# Patient Record
Sex: Female | Born: 1945
Health system: Southern US, Community
[De-identification: ages and names within clinical notes are randomized; demographics above are authoritative.]

## PROBLEM LIST (undated history)

## (undated) DIAGNOSIS — F32A Depression, unspecified: Secondary | ICD-10-CM

## (undated) DIAGNOSIS — F329 Major depressive disorder, single episode, unspecified: Secondary | ICD-10-CM

## (undated) DIAGNOSIS — C50919 Malignant neoplasm of unspecified site of unspecified female breast: Secondary | ICD-10-CM

## (undated) DIAGNOSIS — R519 Headache, unspecified: Secondary | ICD-10-CM

## (undated) DIAGNOSIS — M1990 Unspecified osteoarthritis, unspecified site: Secondary | ICD-10-CM

## (undated) DIAGNOSIS — R51 Headache: Secondary | ICD-10-CM

## (undated) DIAGNOSIS — Z801 Family history of malignant neoplasm of trachea, bronchus and lung: Secondary | ICD-10-CM

## (undated) DIAGNOSIS — I10 Essential (primary) hypertension: Secondary | ICD-10-CM

## (undated) DIAGNOSIS — Z803 Family history of malignant neoplasm of breast: Secondary | ICD-10-CM

## (undated) DIAGNOSIS — F419 Anxiety disorder, unspecified: Secondary | ICD-10-CM

## (undated) DIAGNOSIS — Z800 Family history of malignant neoplasm of digestive organs: Secondary | ICD-10-CM

## (undated) DIAGNOSIS — E039 Hypothyroidism, unspecified: Secondary | ICD-10-CM

## (undated) HISTORY — PX: ADENOIDECTOMY: SUR15

## (undated) HISTORY — DX: Family history of malignant neoplasm of trachea, bronchus and lung: Z80.1

## (undated) HISTORY — DX: Malignant neoplasm of unspecified site of unspecified female breast: C50.919

## (undated) HISTORY — DX: Family history of malignant neoplasm of breast: Z80.3

## (undated) HISTORY — PX: ABDOMINAL HYSTERECTOMY: SHX81

## (undated) HISTORY — PX: BREAST SURGERY: SHX581

## (undated) HISTORY — DX: Family history of malignant neoplasm of digestive organs: Z80.0

## (undated) HISTORY — PX: TONSILLECTOMY: SUR1361

---

## 2005-09-21 ENCOUNTER — Ambulatory Visit (HOSPITAL_COMMUNITY): Admission: RE | Admit: 2005-09-21 | Discharge: 2005-09-22 | Payer: Self-pay | Admitting: Urology

## 2005-11-19 ENCOUNTER — Other Ambulatory Visit: Admission: RE | Admit: 2005-11-19 | Discharge: 2005-11-19 | Payer: Self-pay | Admitting: Family Medicine

## 2007-05-29 IMAGING — CR DG CHEST 2V
2 series · 2 of 2 positions shown · non-contrast
Comparison: none

CLINICAL DATA: Preop respiratory evaluation for rectocele and enterocele repair. 
 CHEST - 2 VIEW:

[w chest pa]
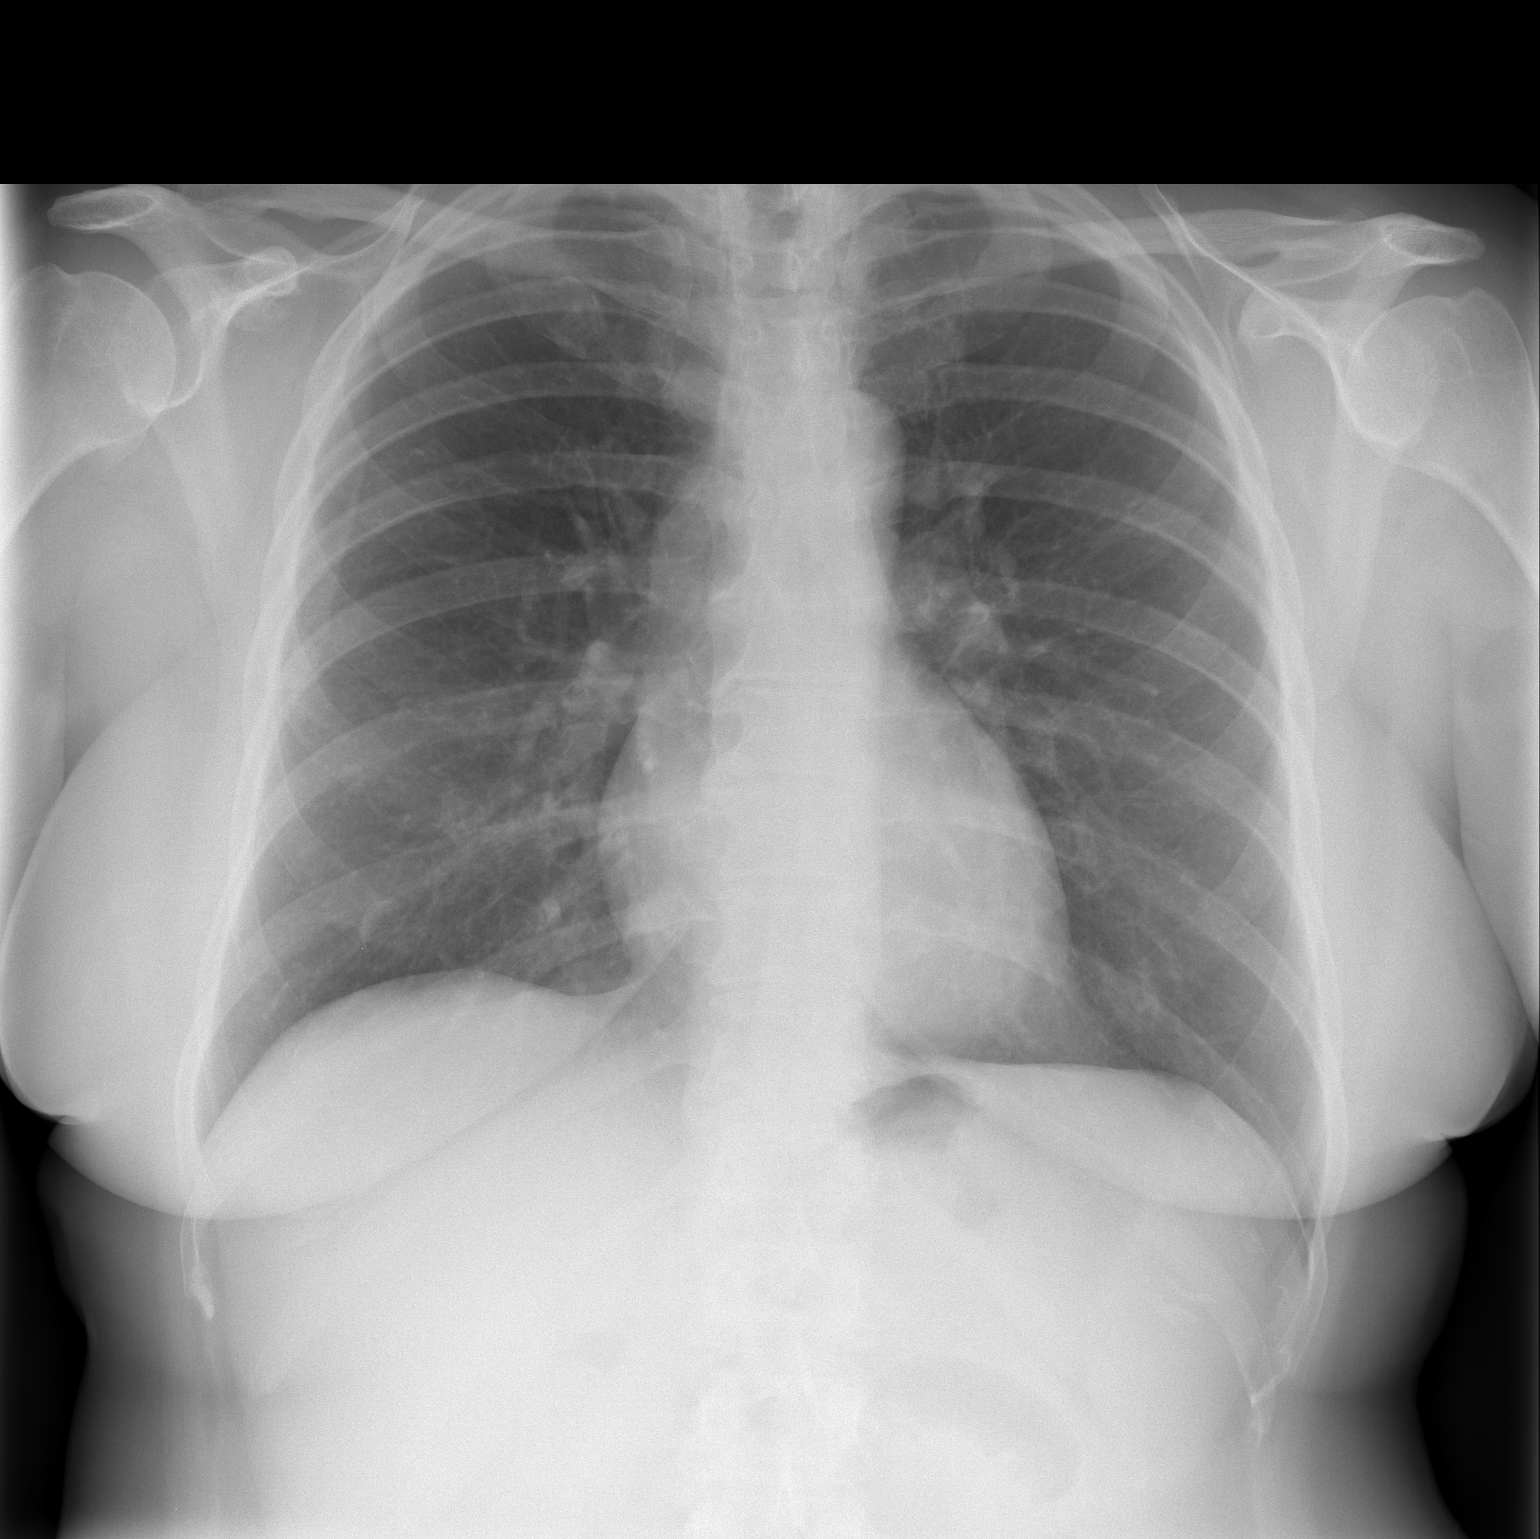

[w chest lat *]
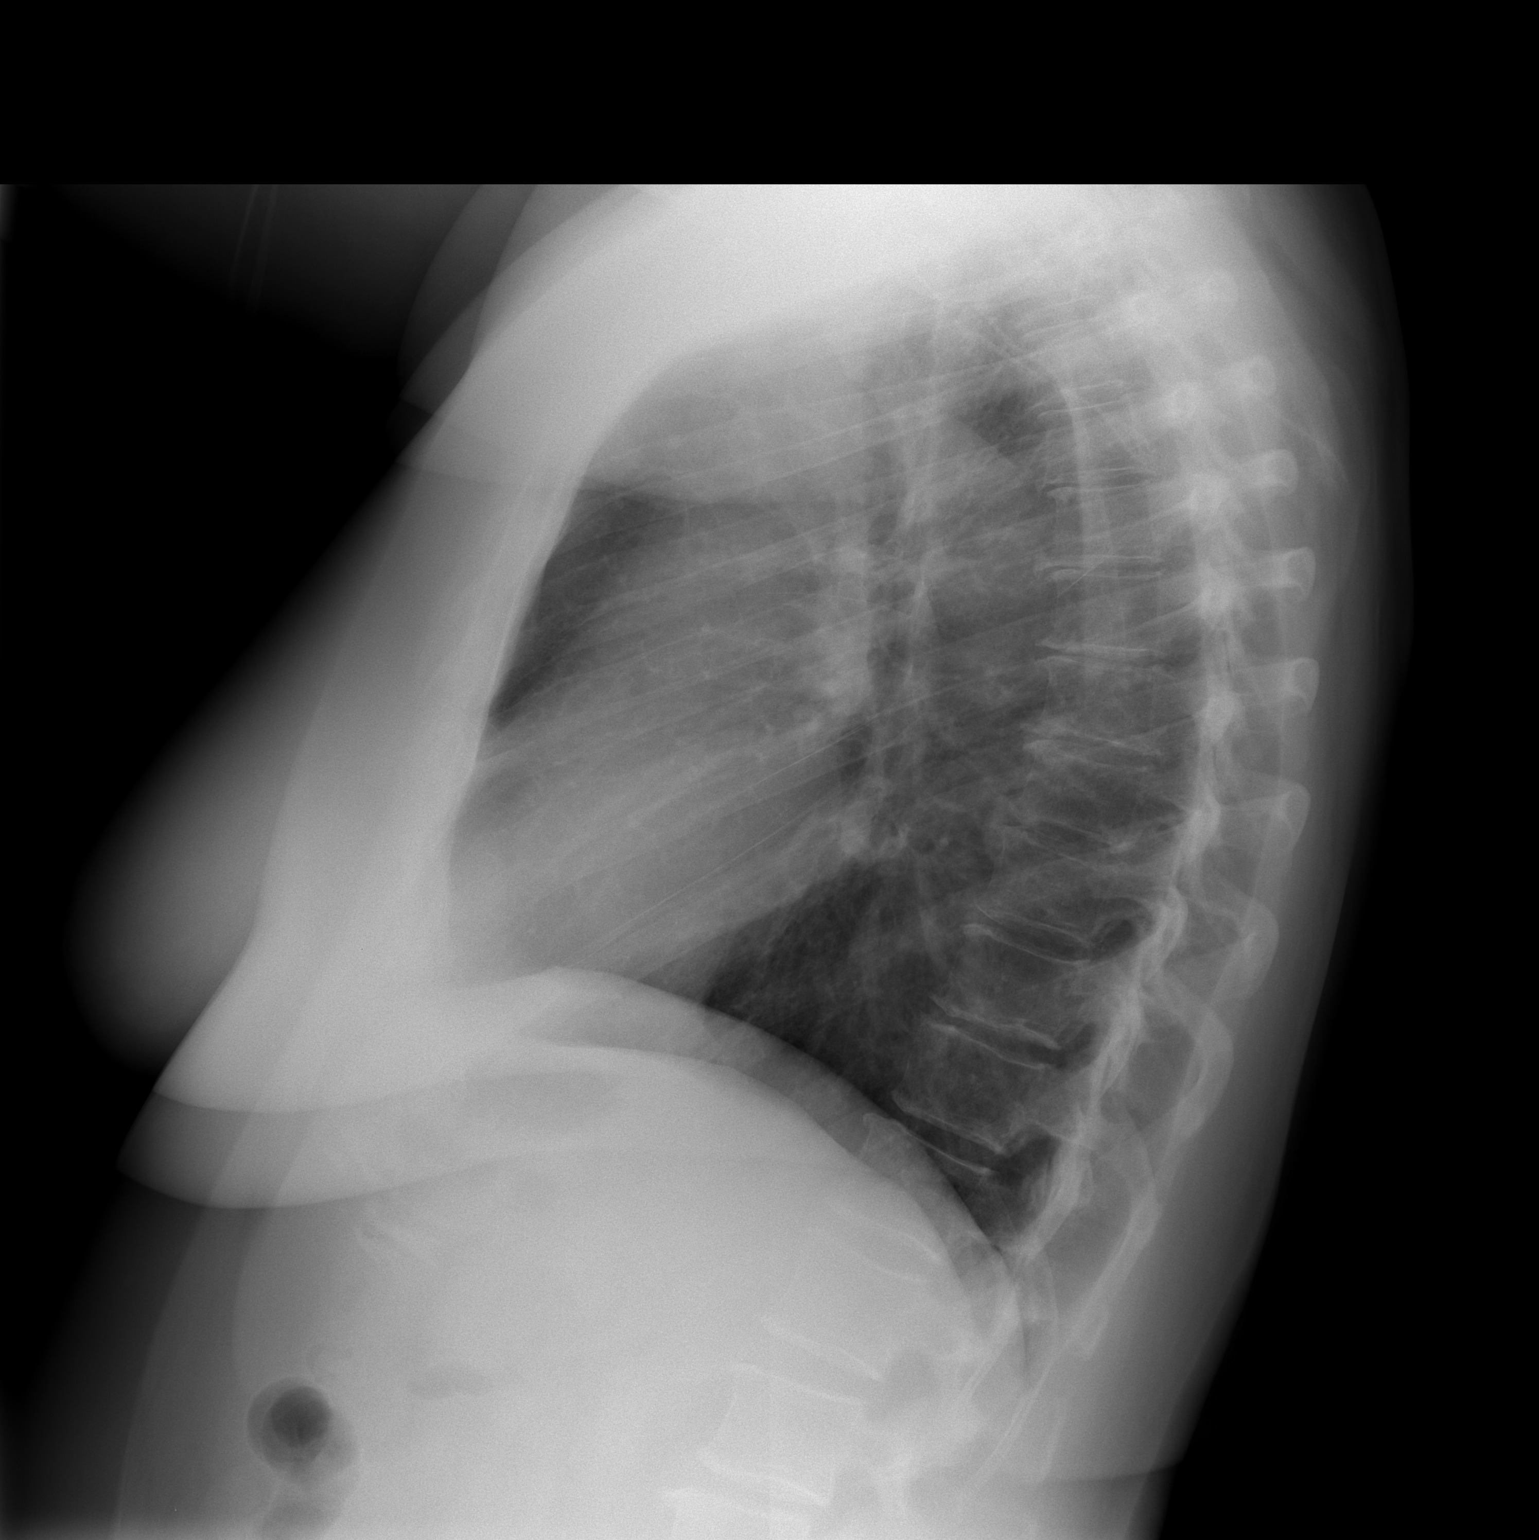

[2 of 2 positions shown; findings below may reference images not displayed]

FINDINGS: The heart size and mediastinal contours are within normal limits.  Both lungs are clear.  The visualized skeletal structures are unremarkable.
IMPRESSION: No active cardiopulmonary disease.

## 2007-12-25 ENCOUNTER — Other Ambulatory Visit: Admission: RE | Admit: 2007-12-25 | Discharge: 2007-12-25 | Payer: Self-pay | Admitting: Family Medicine

## 2008-12-25 ENCOUNTER — Other Ambulatory Visit: Admission: RE | Admit: 2008-12-25 | Discharge: 2008-12-25 | Payer: Self-pay | Admitting: Family Medicine

## 2010-07-03 NOTE — Op Note (Signed)
NAME:  Erika Huynh, Erika Huynh               ACCOUNT NO.:  [id number]   MEDICAL RECORD NO.:  [id number]          PATIENT TYPE:   LOCATION:                                 FACILITY:   PHYSICIAN:  Martina Sinner, MD      DATE OF BIRTH:   DATE OF PROCEDURE:  DATE OF DISCHARGE:                                 OPERATIVE REPORT   PREOPERATIVE DIAGNOSIS:  Rectocele (possible enterocele).   POSTOPERATIVE DIAGNOSIS:  Rectocele.   SURGERY:  Rectocele repair utilizing dermal graft.   Erika Huynh has a symptomatic posterior defect in keeping with a rectocele.  Based upon its size, I suspect she may have an enterocele but my index  suspicion was low when I examined her in the office.   The patient was prepped and draped in the usual fashion.  Extra care was  taken to minimize the risk of compartment syndrome, neuropathy and DVT when  I positioned her legs.  She was given preoperative antibiotics.   On initial examination one could easily identify the vaginal cuff dimples in  the posterior defect.  She had very short perineum.  I removed a small  triangle of perineal skin between two Allis clamps.  Using a lead Allis and  lateral Allis clamps for traction, I made a long posterior midline incision  to approximately 1.5 cm from the vaginal cuff dimples.  I sharply mobilized  the vaginal wall epithelium from the pubocervical fascia to the lateral  sidewall.  There was little to no bleeding, and the dissection went very  well.   Visually I thought she may have an enterocele, especially when I initially  did a digital rectal examination.  I did a number of digital rectal exams  and did a lot of sharp dissection, and my final diagnosis was preperitoneal  fat anterior to the rectum, but no enterocele cavity was located.  My  assistants did a good job in giving counter traction to allow me to sharply  and safely dissect the area question from the anterior vaginal wall.  The  area in question was also  dissected from the lateral wall and from the short  vaginal epithelial cuff.  Attempts at the level of the apex I entered the perineum approximately 1.5  cm in length.  Based upon dissection and by placing my finger into this  area, I felt this represented the lowest aspect of the peritoneum, i.e. the  cul-de-sac, and it did not represent the proximal aspect or neck of an  enterocele.   For this reason I closed the 1.5 cm opening with running 3-0 Vicryl.  Care  was taken not to catch the anterior vaginal epithelium in the closure.   I then did a slight defect repair by pulling the patient's apical and left  laterally displaced pubocervical fascia back to the right side of the  patient and attached it to the pelvic sidewall near the apex of dissection  but bilaterally.  The defect was apical and lateral.  Further 2-0 Vicryls  were used to tack down the rectovaginal fascia to the pelvic  sidewall,  getting deep into the wall laterally so not to leave a ring.   The site defect greatly reduced the rectocele, but I thought it was best to  do a gentle one-layer imbricating suture in the midline to flatten the  rectocele further.  This was done with 2-0 Vicryl on an SH needle.   Because of the size of the defect, I wanted to use a graft.  I also wanted  to place a graft over the small window that was made in the peritoneum.  Using a UR5 needle I placed four 2-0 Vicryl sutures in the pelvic sidewall,  two at the apex and two near the introitus.  Utilizing these, a usual 4 x 7  cm graft was sutured over the repair.  Approximately 2 cm of graft was left  like a flap cephalad, and this was sutured to the apex of 3-0 Vicryl,  allowing [place] to apply the graft over the apex.  The graft was trimmed  distally and sewn just proximal to the perineal body with a 3-0 Vicryl.   Copious irrigation was utilized.  Hemostasis was excellent.  Total blood  loss was less than 50 mL.  Epinephrine with lidocaine was  utilized at the  beginning of the case.  I trimmed approximately 0.5 cm of mucosa  bilaterally.  I then closed the vaginal epithelium with running 2-0 Vicryl  with a CT1 needle.  This was also used as a subcuticular suture in the  perineum.  I did not do any perineal sutures deep in the perineal body.   Vaginal length was excellent.  There was no ring in the vagina.  Hemostasis  was excellent.  A vaginal pack was inserted.   Hopefully, this repair will dramatically improve Erika Huynh quality of life  and improve her treatment goal.           ______________________________  Martina Sinner, MD  Electronically Signed     SAM/MEDQ  D:  09/21/2005  T:  09/21/2005  Job:  [id number]

## 2014-09-26 DIAGNOSIS — M170 Bilateral primary osteoarthritis of knee: Secondary | ICD-10-CM | POA: Diagnosis not present

## 2014-09-26 DIAGNOSIS — M545 Low back pain: Secondary | ICD-10-CM | POA: Diagnosis not present

## 2014-09-26 DIAGNOSIS — M160 Bilateral primary osteoarthritis of hip: Secondary | ICD-10-CM | POA: Diagnosis not present

## 2014-09-30 ENCOUNTER — Ambulatory Visit (INDEPENDENT_AMBULATORY_CARE_PROVIDER_SITE_OTHER): Payer: Medicare PPO | Admitting: Podiatry

## 2014-09-30 ENCOUNTER — Encounter: Payer: Self-pay | Admitting: Podiatry

## 2014-09-30 VITALS — BP 146/76 | HR 61 | Temp 97.9°F | Resp 14

## 2014-09-30 DIAGNOSIS — M79672 Pain in left foot: Secondary | ICD-10-CM | POA: Diagnosis not present

## 2014-09-30 DIAGNOSIS — M898X9 Other specified disorders of bone, unspecified site: Secondary | ICD-10-CM

## 2014-09-30 NOTE — Progress Notes (Signed)
Subjective:     Patient ID: Erika Huynh, female   DOB: [birth year], 69 y.o.   MRN: [id number]  HPIThis patient presents to the office with occasional swollen and painful knot on the center of her midfoot.  She says she has difficulty walking with laced shoes which aggravate the knot walking.  She says the knot has been painful and swollen for 2 months.  She presents to the office for evaluation and treatment.   Review of Systems     Objective:   Physical Exam GENERAL APPEARANCE: Alert, conversant. Appropriately groomed. No acute distress.  VASCULAR: Pedal pulses palpable at  [facility] and PT bilateral.  Capillary refill time is immediate to all digits,  Normal temperature gradient.  Digital hair growth is present bilateral  NEUROLOGIC: sensation is normal to 5.07 monofilament at 5/5 sites bilateral.  Light touch is intact bilateral, Muscle strength normal.  MUSCULOSKELETAL: acceptable muscle strength, tone and stability bilateral.  Intrinsic muscluature intact bilateral.  Rectus appearance of foot and digits noted bilateral. Bony prominence at the base second and third metatarsals left foot.  No radiating pain redness or swelling at this visit.  She has HAV deformities both feet.  DERMATOLOGIC: skin color, texture, and turgor are within normal limits.  No preulcerative lesions or ulcers  are seen, no interdigital maceration noted.  No open lesions present.  Digital nails are asymptomatic. No drainage noted.      Assessment:     Bony exostosis dorsum left foot  HAV  B/L  IE  Discussed condition with patient.  The x-rays were inoperable so x-rays were deferred at this time.     Plan:

## 2014-10-14 DIAGNOSIS — Z23 Encounter for immunization: Secondary | ICD-10-CM | POA: Diagnosis not present

## 2014-10-14 DIAGNOSIS — F324 Major depressive disorder, single episode, in partial remission: Secondary | ICD-10-CM | POA: Diagnosis not present

## 2015-04-08 NOTE — Addendum Note (Signed)
Addended by: Gardiner Barefoot on: 04/08/2015 12:40 PM   Modules accepted: Orders

## 2015-07-03 DIAGNOSIS — M25562 Pain in left knee: Secondary | ICD-10-CM | POA: Insufficient documentation

## 2015-11-06 ENCOUNTER — Other Ambulatory Visit: Payer: Self-pay | Admitting: Orthopedic Surgery

## 2015-12-01 ENCOUNTER — Encounter (HOSPITAL_COMMUNITY)
Admission: RE | Admit: 2015-12-01 | Discharge: 2015-12-01 | Disposition: A | Discharge status: Home / Self Care | Payer: Medicare Other | Source: Ambulatory Visit | Attending: Orthopedic Surgery | Admitting: Orthopedic Surgery

## 2015-12-01 ENCOUNTER — Encounter (HOSPITAL_COMMUNITY): Payer: Self-pay

## 2015-12-01 DIAGNOSIS — I10 Essential (primary) hypertension: Secondary | ICD-10-CM | POA: Diagnosis not present

## 2015-12-01 DIAGNOSIS — Z01818 Encounter for other preprocedural examination: Secondary | ICD-10-CM | POA: Insufficient documentation

## 2015-12-01 DIAGNOSIS — Z01812 Encounter for preprocedural laboratory examination: Secondary | ICD-10-CM | POA: Insufficient documentation

## 2015-12-01 DIAGNOSIS — M1711 Unilateral primary osteoarthritis, right knee: Secondary | ICD-10-CM | POA: Insufficient documentation

## 2015-12-01 HISTORY — DX: Hypothyroidism, unspecified: E03.9

## 2015-12-01 HISTORY — DX: Essential (primary) hypertension: I10

## 2015-12-01 HISTORY — DX: Headache: R51

## 2015-12-01 HISTORY — DX: Depression, unspecified: F32.A

## 2015-12-01 HISTORY — DX: Headache, unspecified: R51.9

## 2015-12-01 HISTORY — DX: Anxiety disorder, unspecified: F41.9

## 2015-12-01 HISTORY — DX: Major depressive disorder, single episode, unspecified: F32.9

## 2015-12-01 HISTORY — DX: Unspecified osteoarthritis, unspecified site: M19.90

## 2015-12-01 LAB — COMPREHENSIVE METABOLIC PANEL
ALT: 18 U/L (ref 14–54)
AST: 22 U/L (ref 15–41)
Albumin: 3.6 g/dL (ref 3.5–5.0)
Alkaline Phosphatase: 77 U/L (ref 38–126)
Anion gap: 6 (ref 5–15)
BILIRUBIN TOTAL: 0.6 mg/dL (ref 0.3–1.2)
BUN: 16 mg/dL (ref 6–20)
CO2: 28 mmol/L (ref 22–32)
CREATININE: 0.95 mg/dL (ref 0.44–1.00)
Calcium: 8.7 mg/dL — ABNORMAL LOW (ref 8.9–10.3)
Chloride: 107 mmol/L (ref 101–111)
GFR, EST NON AFRICAN AMERICAN: 58 mL/min — AB (ref >60)
Glucose, Bld: 85 mg/dL (ref 65–99)
POTASSIUM: 3.9 mmol/L (ref 3.5–5.1)
Sodium: 141 mmol/L (ref 135–145)
TOTAL PROTEIN: 6.1 g/dL — AB (ref 6.5–8.1)

## 2015-12-01 LAB — SURGICAL PCR SCREEN
MRSA, PCR: NEGATIVE
Staphylococcus aureus: NEGATIVE

## 2015-12-01 LAB — CBC WITH DIFFERENTIAL/PLATELET
BASOS ABS: 0 10*3/uL (ref 0.0–0.1)
Basophils Relative: 1 %
EOS PCT: 3 %
Eosinophils Absolute: 0.1 10*3/uL (ref 0.0–0.7)
HEMATOCRIT: 38.9 % (ref 36.0–46.0)
Hemoglobin: 12.8 g/dL (ref 12.0–15.0)
LYMPHS ABS: 1.5 10*3/uL (ref 0.7–4.0)
LYMPHS PCT: 30 %
MCH: 30.5 pg (ref 26.0–34.0)
MCHC: 32.9 g/dL (ref 30.0–36.0)
MCV: 92.8 fL (ref 78.0–100.0)
MONO ABS: 0.6 10*3/uL (ref 0.1–1.0)
MONOS PCT: 12 %
NEUTROS ABS: 2.6 10*3/uL (ref 1.7–7.7)
Neutrophils Relative %: 54 %
Platelets: 187 10*3/uL (ref 150–400)
RBC: 4.19 MIL/uL (ref 3.87–5.11)
RDW: 12.8 % (ref 11.5–15.5)
WBC: 4.8 10*3/uL (ref 4.0–10.5)

## 2015-12-01 NOTE — Pre-Procedure Instructions (Signed)
    Erika Huynh  12/01/2015      [facility] - [place], [place] [address] Phone: [phone] Fax: [phone]    Your procedure is scheduled on 12/08/15.  Report to [facility] Admitting at 840 A.M.  Call this number if you have problems the morning of surgery:  [phone]   Remember:  Do not eat food or drink liquids after midnight.  Take these medicines the morning of surgery with A SIP OF WATER --tylenol,lexapro,synthroid   Do not wear jewelry, make-up or nail polish.  Do not wear lotions, powders, or perfumes, or deoderant.  Do not shave 48 hours prior to surgery.  Men may shave face and neck.  Do not bring valuables to the hospital.  [facility] is not responsible for any belongings or valuables.  Contacts, dentures or bridgework may not be worn into surgery.  Leave your suitcase in the car.  After surgery it may be brought to your room.  For patients admitted to the hospital, discharge time will be determined by your treatment team.  Patients discharged the day of surgery will not be allowed to drive home.   Name and phone number of your driver:    Special instructions:  Do not take any aspirin,anti-inflammatories,vitamins,or herbal supplements 5-7 days prior to surgery.  Please read over the following fact sheets that you were given. MRSA Information

## 2015-12-05 MED ORDER — BUPIVACAINE LIPOSOME 1.3 % IJ SUSP
20.0000 mL | INTRAMUSCULAR | Status: AC
  Administered 2015-12-08: 20 mL
  Filled 2015-12-05: qty 20

## 2015-12-05 MED ORDER — DEXAMETHASONE SODIUM PHOSPHATE 10 MG/ML IJ SOLN
8.0000 mg | INTRAMUSCULAR | Status: AC
  Administered 2015-12-08: 10 mg via INTRAVENOUS
  Filled 2015-12-05: qty 1

## 2015-12-05 MED ORDER — TRANEXAMIC ACID 1000 MG/10ML IV SOLN
1000.0000 mg | INTRAVENOUS | Status: AC
  Administered 2015-12-08: 1000 mg via INTRAVENOUS
  Filled 2015-12-05: qty 10

## 2015-12-08 ENCOUNTER — Ambulatory Visit (HOSPITAL_COMMUNITY): Payer: Medicare Other | Admitting: Anesthesiology

## 2015-12-08 ENCOUNTER — Observation Stay (HOSPITAL_COMMUNITY)
Admission: RE | Admit: 2015-12-08 | Discharge: 2015-12-09 | Disposition: A | Discharge status: Home / Self Care | Payer: Medicare Other | Source: Ambulatory Visit | Attending: Orthopedic Surgery | Admitting: Orthopedic Surgery

## 2015-12-08 ENCOUNTER — Encounter (HOSPITAL_COMMUNITY)
Admission: RE | Disposition: A | Discharge status: Home / Self Care | Payer: Self-pay | Source: Ambulatory Visit | Attending: Orthopedic Surgery

## 2015-12-08 ENCOUNTER — Encounter (HOSPITAL_COMMUNITY): Payer: Self-pay | Admitting: *Deleted

## 2015-12-08 DIAGNOSIS — R262 Difficulty in walking, not elsewhere classified: Secondary | ICD-10-CM

## 2015-12-08 DIAGNOSIS — F329 Major depressive disorder, single episode, unspecified: Secondary | ICD-10-CM | POA: Diagnosis not present

## 2015-12-08 DIAGNOSIS — E039 Hypothyroidism, unspecified: Secondary | ICD-10-CM | POA: Diagnosis not present

## 2015-12-08 DIAGNOSIS — M25561 Pain in right knee: Secondary | ICD-10-CM

## 2015-12-08 DIAGNOSIS — Z6833 Body mass index (BMI) 33.0-33.9, adult: Secondary | ICD-10-CM | POA: Insufficient documentation

## 2015-12-08 DIAGNOSIS — Z885 Allergy status to narcotic agent status: Secondary | ICD-10-CM | POA: Insufficient documentation

## 2015-12-08 DIAGNOSIS — M1711 Unilateral primary osteoarthritis, right knee: Principal | ICD-10-CM | POA: Insufficient documentation

## 2015-12-08 DIAGNOSIS — F419 Anxiety disorder, unspecified: Secondary | ICD-10-CM | POA: Insufficient documentation

## 2015-12-08 DIAGNOSIS — Z7982 Long term (current) use of aspirin: Secondary | ICD-10-CM | POA: Insufficient documentation

## 2015-12-08 DIAGNOSIS — Z888 Allergy status to other drugs, medicaments and biological substances status: Secondary | ICD-10-CM | POA: Insufficient documentation

## 2015-12-08 DIAGNOSIS — M25661 Stiffness of right knee, not elsewhere classified: Secondary | ICD-10-CM

## 2015-12-08 DIAGNOSIS — Z96651 Presence of right artificial knee joint: Secondary | ICD-10-CM

## 2015-12-08 DIAGNOSIS — Z90710 Acquired absence of both cervix and uterus: Secondary | ICD-10-CM | POA: Diagnosis not present

## 2015-12-08 DIAGNOSIS — I10 Essential (primary) hypertension: Secondary | ICD-10-CM | POA: Diagnosis not present

## 2015-12-08 HISTORY — PX: PARTIAL KNEE ARTHROPLASTY: SHX2174

## 2015-12-08 SURGERY — ARTHROPLASTY, KNEE, UNICOMPARTMENTAL
Anesthesia: Spinal | Laterality: Right

## 2015-12-08 MED ORDER — MECLIZINE HCL 25 MG PO TABS
25.0000 mg | ORAL_TABLET | Freq: Three times a day (TID) | ORAL | Status: DC | PRN
  Filled 2015-12-08: qty 1

## 2015-12-08 MED ORDER — PHENOL 1.4 % MT LIQD: 1.0000 | OROMUCOSAL | Status: DC | PRN

## 2015-12-08 MED ORDER — CEFAZOLIN SODIUM-DEXTROSE 2-4 GM/100ML-% IV SOLN
2.0000 g | INTRAVENOUS | Status: AC
  Administered 2015-12-08: 2 g via INTRAVENOUS
  Filled 2015-12-08: qty 100

## 2015-12-08 MED ORDER — CHLORHEXIDINE GLUCONATE 4 % EX LIQD: 60.0000 mL | Freq: Once | CUTANEOUS | Status: DC

## 2015-12-08 MED ORDER — GABAPENTIN 300 MG PO CAPS
300.0000 mg | ORAL_CAPSULE | Freq: Three times a day (TID) | ORAL | Status: DC
  Administered 2015-12-08 – 2015-12-09 (×2): 300 mg via ORAL
  Filled 2015-12-08 (×3): qty 1

## 2015-12-08 MED ORDER — LEVOTHYROXINE SODIUM 75 MCG PO TABS
175.0000 ug | ORAL_TABLET | Freq: Every day | ORAL | Status: DC
  Administered 2015-12-09: 175 ug via ORAL
  Filled 2015-12-08: qty 1

## 2015-12-08 MED ORDER — ESCITALOPRAM OXALATE 10 MG PO TABS
20.0000 mg | ORAL_TABLET | Freq: Every day | ORAL | Status: DC
  Administered 2015-12-09: 20 mg via ORAL
  Filled 2015-12-08: qty 2

## 2015-12-08 MED ORDER — DOCUSATE SODIUM 100 MG PO CAPS
100.0000 mg | ORAL_CAPSULE | Freq: Two times a day (BID) | ORAL | Status: DC
  Administered 2015-12-08 – 2015-12-09 (×2): 100 mg via ORAL
  Filled 2015-12-08 (×3): qty 1

## 2015-12-08 MED ORDER — BUPIVACAINE IN DEXTROSE 0.75-8.25 % IT SOLN
INTRATHECAL | Status: DC | PRN
  Administered 2015-12-08: 13.5 mg via INTRATHECAL

## 2015-12-08 MED ORDER — MEPERIDINE HCL 25 MG/ML IJ SOLN: 6.2500 mg | INTRAMUSCULAR | Status: DC | PRN

## 2015-12-08 MED ORDER — DEXAMETHASONE SODIUM PHOSPHATE 10 MG/ML IJ SOLN
10.0000 mg | Freq: Once | INTRAMUSCULAR | Status: AC
  Administered 2015-12-09: 10 mg via INTRAVENOUS
  Filled 2015-12-08: qty 1

## 2015-12-08 MED ORDER — LOSARTAN POTASSIUM 50 MG PO TABS
100.0000 mg | ORAL_TABLET | Freq: Every day | ORAL | Status: DC
Start: 2015-12-08 — End: 2015-12-09
  Administered 2015-12-09: 100 mg via ORAL
  Filled 2015-12-08: qty 2

## 2015-12-08 MED ORDER — FENTANYL CITRATE (PF) 100 MCG/2ML IJ SOLN
INTRAMUSCULAR | Status: AC
  Filled 2015-12-08: qty 2

## 2015-12-08 MED ORDER — PHENYLEPHRINE HCL 10 MG/ML IJ SOLN
INTRAMUSCULAR | Status: DC | PRN
  Administered 2015-12-08: 40 ug via INTRAVENOUS
  Administered 2015-12-08: 80 ug via INTRAVENOUS
  Administered 2015-12-08: 40 ug via INTRAVENOUS

## 2015-12-08 MED ORDER — TRANEXAMIC ACID 1000 MG/10ML IV SOLN
1000.0000 mg | Freq: Once | INTRAVENOUS | Status: AC
  Administered 2015-12-08: 1000 mg via INTRAVENOUS
  Filled 2015-12-08: qty 10

## 2015-12-08 MED ORDER — MIDAZOLAM HCL 2 MG/2ML IJ SOLN
INTRAMUSCULAR | Status: AC
  Administered 2015-12-08: 1 mg
  Filled 2015-12-08: qty 2

## 2015-12-08 MED ORDER — INFLUENZA VAC SPLIT QUAD 0.5 ML IM SUSY
0.5000 mL | PREFILLED_SYRINGE | INTRAMUSCULAR | Status: AC
  Administered 2015-12-09: 0.5 mL via INTRAMUSCULAR
  Filled 2015-12-08: qty 0.5

## 2015-12-08 MED ORDER — MIDAZOLAM HCL 2 MG/2ML IJ SOLN: 0.5000 mg | Freq: Once | INTRAMUSCULAR | Status: DC | PRN

## 2015-12-08 MED ORDER — BUPIVACAINE-EPINEPHRINE (PF) 0.25% -1:200000 IJ SOLN
INTRAMUSCULAR | Status: DC | PRN
  Administered 2015-12-08: 20 mL

## 2015-12-08 MED ORDER — HYDROMORPHONE HCL 2 MG/ML IJ SOLN
1.0000 mg | INTRAMUSCULAR | Status: DC | PRN
  Administered 2015-12-08: 1 mg via INTRAVENOUS
  Filled 2015-12-08: qty 1

## 2015-12-08 MED ORDER — SENNOSIDES-DOCUSATE SODIUM 8.6-50 MG PO TABS: 1.0000 | ORAL_TABLET | Freq: Every evening | ORAL | Status: DC | PRN

## 2015-12-08 MED ORDER — DIPHENHYDRAMINE HCL 12.5 MG/5ML PO ELIX: 12.5000 mg | ORAL_SOLUTION | ORAL | Status: DC | PRN

## 2015-12-08 MED ORDER — PROPOFOL 500 MG/50ML IV EMUL
INTRAVENOUS | Status: DC | PRN
  Administered 2015-12-08: 20 ug/kg/min via INTRAVENOUS

## 2015-12-08 MED ORDER — METOCLOPRAMIDE HCL 5 MG PO TABS: 5.0000 mg | ORAL_TABLET | Freq: Three times a day (TID) | ORAL | Status: DC | PRN

## 2015-12-08 MED ORDER — ROPIVACAINE HCL 7.5 MG/ML IJ SOLN
INTRAMUSCULAR | Status: DC | PRN
  Administered 2015-12-08: 20 mL via PERINEURAL

## 2015-12-08 MED ORDER — METOCLOPRAMIDE HCL 5 MG/ML IJ SOLN: 5.0000 mg | Freq: Three times a day (TID) | INTRAMUSCULAR | Status: DC | PRN

## 2015-12-08 MED ORDER — BISACODYL 5 MG PO TBEC: 5.0000 mg | DELAYED_RELEASE_TABLET | Freq: Every day | ORAL | Status: DC | PRN

## 2015-12-08 MED ORDER — SODIUM CHLORIDE 0.9 % IV SOLN: INTRAVENOUS | Status: DC

## 2015-12-08 MED ORDER — ACETAMINOPHEN 650 MG RE SUPP: 650.0000 mg | Freq: Four times a day (QID) | RECTAL | Status: DC | PRN

## 2015-12-08 MED ORDER — MIDAZOLAM HCL 5 MG/ML IJ SOLN: 2.0000 mg | Freq: Once | INTRAMUSCULAR | Status: DC

## 2015-12-08 MED ORDER — EPHEDRINE SULFATE 50 MG/ML IJ SOLN
INTRAMUSCULAR | Status: DC | PRN
  Administered 2015-12-08: 5 mg via INTRAVENOUS

## 2015-12-08 MED ORDER — MIDAZOLAM HCL 2 MG/2ML IJ SOLN
INTRAMUSCULAR | Status: AC
  Filled 2015-12-08: qty 2

## 2015-12-08 MED ORDER — ONDANSETRON HCL 4 MG/2ML IJ SOLN
INTRAMUSCULAR | Status: DC | PRN
  Administered 2015-12-08: 4 mg via INTRAVENOUS

## 2015-12-08 MED ORDER — FLEET ENEMA 7-19 GM/118ML RE ENEM: 1.0000 | ENEMA | Freq: Once | RECTAL | Status: DC | PRN

## 2015-12-08 MED ORDER — OXYCODONE HCL 5 MG PO TABS
5.0000 mg | ORAL_TABLET | ORAL | Status: DC | PRN
  Administered 2015-12-08 – 2015-12-09 (×4): 10 mg via ORAL
  Filled 2015-12-08 (×4): qty 2

## 2015-12-08 MED ORDER — FENTANYL CITRATE (PF) 100 MCG/2ML IJ SOLN
INTRAMUSCULAR | Status: DC | PRN
  Administered 2015-12-08 (×2): 25 ug via INTRAVENOUS

## 2015-12-08 MED ORDER — OXYCODONE HCL ER 10 MG PO T12A
10.0000 mg | EXTENDED_RELEASE_TABLET | Freq: Two times a day (BID) | ORAL | Status: DC
  Administered 2015-12-08 – 2015-12-09 (×2): 10 mg via ORAL
  Filled 2015-12-08 (×2): qty 1

## 2015-12-08 MED ORDER — HYDROMORPHONE HCL 1 MG/ML IJ SOLN: 0.2500 mg | INTRAMUSCULAR | Status: DC | PRN

## 2015-12-08 MED ORDER — 0.9 % SODIUM CHLORIDE (POUR BTL) OPTIME
TOPICAL | Status: DC | PRN
  Administered 2015-12-08: 1000 mL

## 2015-12-08 MED ORDER — METHOCARBAMOL 500 MG PO TABS
500.0000 mg | ORAL_TABLET | Freq: Four times a day (QID) | ORAL | Status: DC | PRN
  Administered 2015-12-09: 500 mg via ORAL
  Filled 2015-12-08: qty 1

## 2015-12-08 MED ORDER — ZOLPIDEM TARTRATE 5 MG PO TABS: 5.0000 mg | ORAL_TABLET | Freq: Every evening | ORAL | Status: DC | PRN

## 2015-12-08 MED ORDER — ACETAMINOPHEN 500 MG PO TABS
ORAL_TABLET | ORAL | Status: AC
  Filled 2015-12-08: qty 2

## 2015-12-08 MED ORDER — METHOCARBAMOL 1000 MG/10ML IJ SOLN
500.0000 mg | Freq: Four times a day (QID) | INTRAMUSCULAR | Status: DC | PRN
  Filled 2015-12-08: qty 5

## 2015-12-08 MED ORDER — CELECOXIB 200 MG PO CAPS
200.0000 mg | ORAL_CAPSULE | Freq: Two times a day (BID) | ORAL | Status: DC
  Administered 2015-12-08 – 2015-12-09 (×2): 200 mg via ORAL
  Filled 2015-12-08 (×2): qty 1

## 2015-12-08 MED ORDER — ONDANSETRON HCL 4 MG/2ML IJ SOLN: 4.0000 mg | Freq: Four times a day (QID) | INTRAMUSCULAR | Status: DC | PRN

## 2015-12-08 MED ORDER — PROPOFOL 10 MG/ML IV BOLUS
INTRAVENOUS | Status: AC
  Filled 2015-12-08: qty 20

## 2015-12-08 MED ORDER — CEFAZOLIN IN D5W 1 GM/50ML IV SOLN
1.0000 g | Freq: Four times a day (QID) | INTRAVENOUS | Status: AC
  Administered 2015-12-08 – 2015-12-09 (×2): 1 g via INTRAVENOUS
  Filled 2015-12-08 (×2): qty 50

## 2015-12-08 MED ORDER — SODIUM CHLORIDE 0.9 % IV SOLN
INTRAVENOUS | Status: DC
  Administered 2015-12-08: 17:00:00 via INTRAVENOUS

## 2015-12-08 MED ORDER — ONDANSETRON HCL 4 MG PO TABS: 4.0000 mg | ORAL_TABLET | Freq: Four times a day (QID) | ORAL | Status: DC | PRN

## 2015-12-08 MED ORDER — SODIUM CHLORIDE 0.9 % IJ SOLN
INTRAMUSCULAR | Status: DC | PRN
  Administered 2015-12-08: 10 mL

## 2015-12-08 MED ORDER — ACETAMINOPHEN 325 MG PO TABS
650.0000 mg | ORAL_TABLET | Freq: Four times a day (QID) | ORAL | Status: DC | PRN
  Administered 2015-12-08: 650 mg via ORAL
  Filled 2015-12-08: qty 2

## 2015-12-08 MED ORDER — ASPIRIN EC 325 MG PO TBEC
325.0000 mg | DELAYED_RELEASE_TABLET | Freq: Two times a day (BID) | ORAL | Status: DC
  Administered 2015-12-08 – 2015-12-09 (×2): 325 mg via ORAL
  Filled 2015-12-08 (×2): qty 1

## 2015-12-08 MED ORDER — PROMETHAZINE HCL 25 MG/ML IJ SOLN: 6.2500 mg | INTRAMUSCULAR | Status: DC | PRN

## 2015-12-08 MED ORDER — LACTATED RINGERS IV SOLN
INTRAVENOUS | Status: DC
  Administered 2015-12-08 (×2): via INTRAVENOUS

## 2015-12-08 MED ORDER — ALUM & MAG HYDROXIDE-SIMETH 200-200-20 MG/5ML PO SUSP: 30.0000 mL | ORAL | Status: DC | PRN

## 2015-12-08 MED ORDER — ACETAMINOPHEN 500 MG PO TABS
1000.0000 mg | ORAL_TABLET | Freq: Once | ORAL | Status: AC
  Administered 2015-12-08: 1000 mg via ORAL

## 2015-12-08 MED ORDER — FENTANYL CITRATE (PF) 100 MCG/2ML IJ SOLN
100.0000 ug | Freq: Once | INTRAMUSCULAR | Status: AC
  Administered 2015-12-08: 100 ug via INTRAVENOUS

## 2015-12-08 MED ORDER — MENTHOL 3 MG MT LOZG: 1.0000 | LOZENGE | OROMUCOSAL | Status: DC | PRN

## 2015-12-08 SURGICAL SUPPLY — 76 items
APL SKNCLS STERI-STRIP NONHPOA (GAUZE/BANDAGES/DRESSINGS) ×1
BANDAGE ELASTIC 6 VELCRO ST LF (GAUZE/BANDAGES/DRESSINGS) ×2 IMPLANT
BANDAGE ESMARK 6X9 LF (GAUZE/BANDAGES/DRESSINGS) ×1 IMPLANT
BEARING TIBIAL IBAL UKA SZ1 9M (Knees) ×4 IMPLANT
BENZOIN TINCTURE PRP APPL 2/3 (GAUZE/BANDAGES/DRESSINGS) ×2 IMPLANT
BLADE RECIPROCATING TAPERED (BLADE) ×2 IMPLANT
BLADE SAW RECIP 87.9 MT (BLADE) IMPLANT
BLADE SAW SGTL 13X75X1.27 (BLADE) ×2 IMPLANT
BLADE SAW SGTL 83.5X18.5 (BLADE) ×2 IMPLANT
BNDG CMPR 9X6 STRL LF SNTH (GAUZE/BANDAGES/DRESSINGS) ×1
BNDG CMPR MED 10X6 ELC LF (GAUZE/BANDAGES/DRESSINGS) ×1
BNDG ELASTIC 6X10 VLCR STRL LF (GAUZE/BANDAGES/DRESSINGS) ×3 IMPLANT
BNDG ESMARK 6X9 LF (GAUZE/BANDAGES/DRESSINGS) ×3
BOWL SMART MIX CTS (DISPOSABLE) ×3 IMPLANT
BRNG TIB 1 9 RSRFC KN UNCNDL (Knees) ×2 IMPLANT
CEMENT BONE SIMPLEX SPEEDSET (Cement) ×5 IMPLANT
CLOSURE STERI-STRIP 1/2X4 (GAUZE/BANDAGES/DRESSINGS) ×1
CLSR STERI-STRIP ANTIMIC 1/2X4 (GAUZE/BANDAGES/DRESSINGS) ×1 IMPLANT
COMP FEMUR SZ2 IBAL RT MED (Knees) ×3 IMPLANT
COMPONENT FEMUR SZ2 IBALRT MED (Knees) IMPLANT
COVER SURGICAL LIGHT HANDLE (MISCELLANEOUS) ×4 IMPLANT
CUFF TOURNIQUET SINGLE 34IN LL (TOURNIQUET CUFF) ×3 IMPLANT
DRAPE EXTREMITY T 121X128X90 (DRAPE) ×3 IMPLANT
DRAPE IMP U-DRAPE 54X76 (DRAPES) ×3 IMPLANT
DRAPE INCISE IOBAN 66X45 STRL (DRAPES) ×5 IMPLANT
DRAPE PROXIMA HALF (DRAPES) ×1 IMPLANT
DRAPE U-SHAPE 47X51 STRL (DRAPES) ×3 IMPLANT
DRESSING AQUACEL AG SP 3.5X10 (GAUZE/BANDAGES/DRESSINGS) IMPLANT
DRSG ADAPTIC 3X8 NADH LF (GAUZE/BANDAGES/DRESSINGS) ×1 IMPLANT
DRSG AQUACEL AG SP 3.5X10 (GAUZE/BANDAGES/DRESSINGS) ×3
DRSG PAD ABDOMINAL 8X10 ST (GAUZE/BANDAGES/DRESSINGS) ×3 IMPLANT
DURAPREP 26ML APPLICATOR (WOUND CARE) ×4 IMPLANT
ELECT REM PT RETURN 9FT ADLT (ELECTROSURGICAL) ×3
ELECTRODE REM PT RTRN 9FT ADLT (ELECTROSURGICAL) ×1 IMPLANT
EVACUATOR 1/8 PVC DRAIN (DRAIN) ×1 IMPLANT
FLUID NSS /IRRIG 3000 ML XXX (IV SOLUTION) ×3 IMPLANT
GAUZE SPONGE 4X4 12PLY STRL (GAUZE/BANDAGES/DRESSINGS) ×1 IMPLANT
GLOVE BIOGEL M 7.0 STRL (GLOVE) ×2 IMPLANT
GLOVE BIOGEL PI IND STRL 7.5 (GLOVE) IMPLANT
GLOVE BIOGEL PI IND STRL 8.5 (GLOVE) ×5 IMPLANT
GLOVE BIOGEL PI INDICATOR 7.5 (GLOVE) ×2
GLOVE BIOGEL PI INDICATOR 8.5 (GLOVE) ×4
GLOVE SURG ORTHO 8.0 STRL STRW (GLOVE) ×8 IMPLANT
GOWN STRL REUS W/ TWL LRG LVL3 (GOWN DISPOSABLE) ×1 IMPLANT
GOWN STRL REUS W/ TWL XL LVL3 (GOWN DISPOSABLE) ×2 IMPLANT
GOWN STRL REUS W/TWL 2XL LVL3 (GOWN DISPOSABLE) ×1 IMPLANT
GOWN STRL REUS W/TWL LRG LVL3 (GOWN DISPOSABLE) ×3
GOWN STRL REUS W/TWL XL LVL3 (GOWN DISPOSABLE) ×6
HANDPIECE INTERPULSE COAX TIP (DISPOSABLE) ×3
HOOD PEEL AWAY FACE SHEILD DIS (HOOD) ×9 IMPLANT
KIT BASIN OR (CUSTOM PROCEDURE TRAY) ×3 IMPLANT
KIT ROOM TURNOVER OR (KITS) ×3 IMPLANT
MANIFOLD NEPTUNE II (INSTRUMENTS) ×3 IMPLANT
NDL HYPO 21X1 ECLIPSE (NEEDLE) ×1 IMPLANT
NEEDLE 21X1 OR PACK (NEEDLE) ×3 IMPLANT
NEEDLE HYPO 21X1 ECLIPSE (NEEDLE) ×6 IMPLANT
NS IRRIG 1000ML POUR BTL (IV SOLUTION) ×3 IMPLANT
PACK TOTAL JOINT (CUSTOM PROCEDURE TRAY) ×3 IMPLANT
PACK UNIVERSAL I (CUSTOM PROCEDURE TRAY) ×1 IMPLANT
PAD ARMBOARD 7.5X6 YLW CONV (MISCELLANEOUS) ×4 IMPLANT
PADDING CAST COTTON 6X4 STRL (CAST SUPPLIES) ×1 IMPLANT
SET HNDPC FAN SPRY TIP SCT (DISPOSABLE) ×1 IMPLANT
STAPLER VISISTAT 35W (STAPLE) ×1 IMPLANT
SUCTION FRAZIER HANDLE 10FR (MISCELLANEOUS) ×2
SUCTION TUBE FRAZIER 10FR DISP (MISCELLANEOUS) ×1 IMPLANT
SUT VIC AB 0 CT1 27 (SUTURE) ×6
SUT VIC AB 0 CT1 27XBRD ANBCTR (SUTURE) ×2 IMPLANT
SUT VIC AB 1 CT1 27 (SUTURE) ×3
SUT VIC AB 1 CT1 27XBRD ANBCTR (SUTURE) ×1 IMPLANT
SUT VIC AB 2-0 CT1 27 (SUTURE) ×3
SUT VIC AB 2-0 CT1 TAPERPNT 27 (SUTURE) ×1 IMPLANT
SYR 20CC LL (SYRINGE) ×6 IMPLANT
TIBIAL TRAY IBAL UKA SZ1 RT ME (Knees) ×3 IMPLANT
TOWEL OR 17X24 6PK STRL BLUE (TOWEL DISPOSABLE) ×3 IMPLANT
TOWEL OR 17X26 10 PK STRL BLUE (TOWEL DISPOSABLE) ×3 IMPLANT
TRAY TIBIAL IBAL UKA SZ1 RT ME (Knees) IMPLANT

## 2015-12-08 NOTE — Anesthesia Preprocedure Evaluation (Addendum)
Anesthesia Evaluation  Patient identified by MRN, date of birth, ID band Patient awake    Reviewed: Allergy & Precautions, NPO status , Patient's Chart, lab work & pertinent test results  History of Anesthesia Complications Negative for: history of anesthetic complications  Airway Mallampati: II  TM Distance: >3 FB Neck ROM: Full    Dental  (+) Dental Advisory Given   Pulmonary neg pulmonary ROS,    breath sounds clear to auscultation       Cardiovascular hypertension, Pt. on medications (-) angina Rhythm:Regular Rate:Normal     Neuro/Psych Anxiety Depression meniere's    GI/Hepatic negative GI ROS, Neg liver ROS,   Endo/Other  Hypothyroidism Morbid obesity  Renal/GU negative Renal ROS     Musculoskeletal  (+) Arthritis , Osteoarthritis,    Abdominal (+) + obese,   Peds  Hematology negative hematology ROS (+)   Anesthesia Other Findings   Reproductive/Obstetrics                            Anesthesia Physical Anesthesia Plan  ASA: II  Anesthesia Plan: Spinal   Post-op Pain Management:  Regional for Post-op pain   Induction:   Airway Management Planned: Natural Airway and Simple Face Mask  Additional Equipment:   Intra-op Plan:   Post-operative Plan:   Informed Consent: I have reviewed the patients History and Physical, chart, labs and discussed the procedure including the risks, benefits and alternatives for the proposed anesthesia with the patient or authorized representative who has indicated his/her understanding and acceptance.   Dental advisory given  Plan Discussed with: CRNA and Surgeon  Anesthesia Plan Comments: (Plan routine monitors, SAB with adductor canal block for post op analgesia)        Anesthesia Quick Evaluation

## 2015-12-08 NOTE — Evaluation (Signed)
Physical Therapy Evaluation Patient Details Name: Erika Huynh MRN: [id number] DOB: 1945/10/21 Today's Date: 12/08/2015   History of Present Illness  70 y.o. female admitted to [facility] on 12/08/15 for elective R unicompartmental TKA.  Pt with significant PMHx of HTN, HA, and depression/anxiety.  Clinical Impression  Pt is POD #0 and is moving well and able to walk a good distance down the hallway this evening.  I anticipate she will progress well enough to d/c home with family's assist.   PT to follow acutely for deficits listed below.       Follow Up Recommendations Home health PT;Supervision for mobility/OOB    Equipment Recommendations  3in1 (PT)    Recommendations for Other Services   NA    Precautions / Restrictions Precautions Precautions: Knee Precaution Booklet Issued: Yes (comment) Precaution Comments: exercise handout given Restrictions Weight Bearing Restrictions: Yes RLE Weight Bearing: Weight bearing as tolerated      Mobility  Bed Mobility Overal bed mobility: Modified Independent             General bed mobility comments: HOB elevated and pt using railing for leverage  Transfers Overall transfer level: Needs assistance Equipment used: Rolling walker (2 wheeled) Transfers: Sit to/from Stand Sit to Stand: Min guard         General transfer comment: Min guard assist for safety and balance check during transitions.  Verbal cues for safe hand placement.   Ambulation/Gait Ambulation/Gait assistance: Min guard Ambulation Distance (Feet): 130 Feet Assistive device: Rolling walker (2 wheeled) Gait Pattern/deviations: Step-through pattern;Antalgic   Gait velocity interpretation: Below normal speed for age/gender General Gait Details: mildly antalgic gait pattern, verbal cues for upright posture and safe use of RW (closer proximity to RW with her body).           Balance Overall balance assessment: Needs assistance Sitting-balance support: Feet  supported;No upper extremity supported Sitting balance-Leahy Scale: Good     Standing balance support: Bilateral upper extremity supported Standing balance-Leahy Scale: Poor                               Pertinent Vitals/Pain Pain Assessment: 0-10 Pain Score: 5  Pain Location: right knee Pain Descriptors / Indicators: Grimacing;Guarding Pain Intervention(s): Limited activity within patient's tolerance;Monitored during session;Repositioned    Home Living Family/patient expects to be discharged to:: Private residence Living Arrangements: Spouse/significant other Available Help at Discharge: Family;Available 24 hours/day Type of Home: House Home Access: Stairs to enter Entrance Stairs-Rails: None Entrance Stairs-Number of Steps: 2 Home Layout: Two level Home Equipment: Walker - 2 wheels;Shower seat      Prior Function Level of Independence: Independent                  Extremity/Trunk Assessment   Upper Extremity Assessment: Defer to OT evaluation           Lower Extremity Assessment: RLE deficits/detail RLE Deficits / Details: right leg with normal post op pain and weakness, some numbness in genitals and glutes, but not in LEs now.  Ankle at least 3/5, knee 3/5, hip 3/5    Cervical / Trunk Assessment: Normal  Communication   Communication: No difficulties  Cognition Arousal/Alertness: Awake/alert Behavior During Therapy: WFL for tasks assessed/performed Overall Cognitive Status: Within Functional Limits for tasks assessed                         Exercises  Total Joint Exercises Ankle Circles/Pumps: AROM;Both;20 reps Heel Slides: AROM;Right;5 reps Long Arc Quad: AROM;Right;5 reps   Assessment/Plan    PT Assessment Patient needs continued PT services  PT Problem List Decreased strength;Decreased range of motion;Decreased activity tolerance;Decreased balance;Decreased mobility;Decreased knowledge of use of DME;Decreased knowledge of  precautions;Pain          PT Treatment Interventions DME instruction;Gait training;Stair training;Functional mobility training;Therapeutic activities;Therapeutic exercise;Balance training;Patient/family education;Manual techniques;Modalities    PT Goals (Current goals can be found in the Care Plan section)  Acute Rehab PT Goals Patient Stated Goal: to go home tomorrow PT Goal Formulation: With patient Time For Goal Achievement: 12/15/15 Potential to Achieve Goals: Good    Frequency 7X/week        End of Session Equipment Utilized During Treatment: Gait belt Activity Tolerance: Patient tolerated treatment well Patient left: in chair;with call bell/phone within reach;with family/visitor present      Functional Assessment Tool Used: assist level Functional Limitation: Mobility: Walking and moving around Mobility: Walking and Moving Around Current Status [phone]): At least 20 percent but less than 40 percent impaired, limited or restricted Mobility: Walking and Moving Around Goal Status [phone]): At least 1 percent but less than 20 percent impaired, limited or restricted    Time: 1729-1753 PT Time Calculation (min) (ACUTE ONLY): 24 min   Charges:   PT Evaluation $PT Eval Moderate Complexity: 1 Procedure PT Treatments $Gait Training: 8-22 mins   PT G Codes:   PT G-Codes **NOT FOR INPATIENT CLASS** Functional Assessment Tool Used: assist level Functional Limitation: Mobility: Walking and moving around Mobility: Walking and Moving Around Current Status [id number]): At least 20 percent but less than 40 percent impaired, limited or restricted Mobility: Walking and Moving Around Goal Status [phone]): At least 1 percent but less than 20 percent impaired, limited or restricted    Erika Huynh B. Jacquelina Hewins, PT, DPT [phone]   12/08/2015, 6:00 PM

## 2015-12-08 NOTE — Anesthesia Procedure Notes (Signed)
Anesthesia Regional Block:  Adductor canal block  Pre-Anesthetic Checklist: ,, timeout performed, Correct Patient, Correct Site, Correct Laterality, Correct Procedure, Correct Position, site marked, Risks and benefits discussed,  Surgical consent,  Pre-op evaluation,  At surgeon's request and post-op pain management  Laterality: Right and Lower  Prep: chloraprep       Needles:  Injection technique: Single-shot  Needle Type: Echogenic Needle     Needle Length: 9cm 9 cm Needle Gauge: 22 and 22 G    Additional Needles:  Procedures: ultrasound guided (picture in chart) Adductor canal block Narrative:  Start time: 12/08/2015 10:54 AM End time: 12/08/2015 11:00 AM Injection made incrementally with aspirations every 5 mL.  Performed by: Personally  Anesthesiologist: Glennon Mac, Tiasha Helvie  Additional Notes: Pt identified in Holding room.  Monitors applied. Working IV access confirmed. Sterile prep, drape R thigh.  #22ga ECHOgenic needle into adductor canal with US guidance.  20cc 0.75% Ropivacaine injected incrementally after negative test dose.  Patient asymptomatic, VSS, no heme aspirated, tolerated well.

## 2015-12-08 NOTE — Transfer of Care (Signed)
Immediate Anesthesia Transfer of Care Note  Patient: Erika Huynh  Procedure(s) Performed: Procedure(s): UNICOMPARTMENTAL KNEE (Right)  Patient Location: PACU  Anesthesia Type:Spinal  Level of Consciousness: awake, alert  and oriented  Airway & Oxygen Therapy: Patient Spontanous Breathing and Patient connected to nasal cannula oxygen  Post-op Assessment: Report given to RN and Post -op Vital signs reviewed and stable  Post vital signs: Reviewed and stable  Last Vitals:  Vitals:   12/08/15 0853 12/08/15 1050  BP: 129/89 (!) 156/59  Pulse: 64 (!) 59  Resp: 20 15  Temp: 37 C     Last Pain:  Vitals:   12/08/15 0853  TempSrc: Oral      Patients Stated Pain Goal: 3 ([id number])  Complications: No apparent anesthesia complications

## 2015-12-08 NOTE — Anesthesia Procedure Notes (Signed)
Spinal  Patient location during procedure: OR End time: 12/08/2015 11:53 AM Staffing Anesthesiologist: Annye Asa Performed: anesthesiologist  Preanesthetic Checklist Completed: patient identified, site marked, surgical consent, pre-op evaluation, timeout performed, IV checked, risks and benefits discussed and monitors and equipment checked Spinal Block Patient position: sitting Prep: Betadine, ChloraPrep and site prepped and draped Patient monitoring: heart rate, cardiac monitor, continuous pulse ox and blood pressure Approach: right paramedian Location: L3-4 Injection technique: single-shot Needle Needle type: Quincke  Needle gauge: 25 G Needle length: 9 cm Additional Notes Pt identified in Operating room.  Monitors applied. Working IV access confirmed. Sterile prep, drape lumbar spine.  1% lido local L 3,4, unable to enter CSF at L 2,3 and L3,4.  #25ga Quincke into clear CSF R paramedian L 3,4.  13.5mg  0.75% Bupivacaine with dextrose injected with asp CSF beginning and end of injection.  Patient asymptomatic, no heme aspirated, tolerated well.  Jenita Seashore, MD

## 2015-12-08 NOTE — Progress Notes (Signed)
Orthopedic Tech Progress Note Patient Details:  Erika Huynh 06-13-1945 [id number]  CPM Right Knee CPM Right Knee: On Right Knee Flexion (Degrees): 90 Right Knee Extension (Degrees): 0 Additional Comments: trapeze bar patient helper   Hildred Priest 12/08/2015, 3:34 PM Viewed order from doctors order list.

## 2015-12-08 NOTE — Anesthesia Postprocedure Evaluation (Signed)
Anesthesia Post Note  Patient: Erika Huynh  Procedure(s) Performed: Procedure(s) (LRB): UNICOMPARTMENTAL KNEE (Right)  Patient location during evaluation: PACU Anesthesia Type: Spinal and Regional Level of consciousness: awake and alert, oriented and patient cooperative Pain management: pain level controlled Vital Signs Assessment: post-procedure vital signs reviewed and stable Respiratory status: spontaneous breathing, nonlabored ventilation and respiratory function stable Cardiovascular status: blood pressure returned to baseline and stable Postop Assessment: spinal receding and no signs of nausea or vomiting Anesthetic complications: no    Last Vitals:  Vitals:   12/08/15 1545 12/08/15 1643  BP: 126/74 131/71  Pulse: 60 62  Resp: 15 16  Temp: 36.7 C 36.8 C    Last Pain:  Vitals:   12/08/15 1657  TempSrc:   PainSc: 3                  Parris Cudworth,E. Sorin Frimpong

## 2015-12-08 NOTE — H&P (Signed)
Erika Huynh MRN:  [id number] DOB/SEX:  [birth year]/female  CHIEF COMPLAINT:  Painful right Knee  HISTORY: Patient is a 70 y.o. female presented with a history of pain in the right knee. Onset of symptoms was gradual starting a few years ago with gradually worsening course since that time. Patient has been treated conservatively with over-the-counter NSAIDs and activity modification. Patient currently rates pain in the knee at 10 out of 10 with activity. There is pain at night.  PAST MEDICAL HISTORY: There are no active problems to display for this patient.  Past Medical History:  Diagnosis Date  . Anxiety   . Arthritis   . Depression   . Headache   . Hypertension   . Hypothyroidism    Past Surgical History:  Procedure Laterality Date  . ABDOMINAL HYSTERECTOMY    . BREAST SURGERY     cyst removed     MEDICATIONS:   No prescriptions prior to admission.    ALLERGIES:   Allergies  Allergen Reactions  . Demerol [Meperidine] Nausea Only and Other (See Comments)    Can not eat, no appetite  . Propoxyphene Nausea Only and Other (See Comments)    NO APPETITE CAN NOT EAT    REVIEW OF SYSTEMS:  A comprehensive review of systems was negative except for: Musculoskeletal: positive for arthralgias and bone pain   FAMILY HISTORY:  No family history on file.  SOCIAL HISTORY:   Social History  Substance Use Topics  . Smoking status: Never Smoker  . Smokeless tobacco: Never Used  . Alcohol use 0.0 oz/week     Comment: occas.     EXAMINATION:  Vital signs in last 24 hours:    There were no vitals taken for this visit.  General Appearance:    Alert, cooperative, no distress, appears stated age  Head:    Normocephalic, without obvious abnormality, atraumatic  Eyes:    PERRL, conjunctiva/corneas clear, EOM's intact, fundi    benign, both eyes  Ears:    Normal TM's and external ear canals, both ears  Nose:   Nares normal, septum midline, mucosa normal, no drainage    or  sinus tenderness  Throat:   Lips, mucosa, and tongue normal; teeth and gums normal  Neck:   Supple, symmetrical, trachea midline, no adenopathy;    thyroid:  no enlargement/tenderness/nodules; no carotid   bruit or JVD  Back:     Symmetric, no curvature, ROM normal, no CVA tenderness  Lungs:     Clear to auscultation bilaterally, respirations unlabored  Chest Wall:    No tenderness or deformity   Heart:    Regular rate and rhythm, S1 and S2 normal, no murmur, rub   or gallop  Breast Exam:    No tenderness, masses, or nipple abnormality  Abdomen:     Soft, non-tender, bowel sounds active all four quadrants,    no masses, no organomegaly  Genitalia:    Normal female without lesion, discharge or tenderness  Rectal:    Normal tone, no masses or tenderness;   guaiac negative stool  Extremities:   Extremities normal, atraumatic, no cyanosis or edema  Pulses:   2+ and symmetric all extremities  Skin:   Skin color, texture, turgor normal, no rashes or lesions  Lymph nodes:   Cervical, supraclavicular, and axillary nodes normal  Neurologic:   CNII-XII intact, normal strength, sensation and reflexes    throughout     Musculoskeletal:  ROM 0-120, Ligaments intact,  Imaging Review Plain radiographs  demonstrate severe degenerative joint disease of the right knee medial compartment. The overall alignment is mild varus. The bone quality appears to be excellent for age and reported activity level.  Assessment/Plan: Primary osteoarthritis, right knee   The patient history, physical examination and imaging studies are consistent with advanced degenerative joint disease of the right knee medial compartment. The patient has failed conservative treatment.  The clearance notes were reviewed.  After discussion with the patient it was felt that Partial Knee Replacement was indicated. The procedure,  risks, and benefits of Partial knee arthroplasty were presented and reviewed. The risks including but not  limited to aseptic loosening, infection, blood clots, vascular injury, stiffness, patella tracking problems complications among others were discussed. The patient acknowledged the explanation, agreed to proceed with the plan. Donia Ast 12/08/2015, 6:31 AM

## 2015-12-09 ENCOUNTER — Encounter (HOSPITAL_COMMUNITY): Payer: Self-pay | Admitting: Orthopedic Surgery

## 2015-12-09 DIAGNOSIS — M1711 Unilateral primary osteoarthritis, right knee: Secondary | ICD-10-CM | POA: Diagnosis not present

## 2015-12-09 LAB — BASIC METABOLIC PANEL
ANION GAP: 8 (ref 5–15)
BUN: 12 mg/dL (ref 6–20)
CO2: 28 mmol/L (ref 22–32)
Calcium: 8.4 mg/dL — ABNORMAL LOW (ref 8.9–10.3)
Chloride: 102 mmol/L (ref 101–111)
Creatinine, Ser: 0.84 mg/dL (ref 0.44–1.00)
GFR calc Af Amer: >60 mL/min (ref >60)
GFR calc non Af Amer: >60 mL/min (ref >60)
GLUCOSE: 128 mg/dL — AB (ref 65–99)
POTASSIUM: 4.3 mmol/L (ref 3.5–5.1)
Sodium: 138 mmol/L (ref 135–145)

## 2015-12-09 LAB — CBC
HEMATOCRIT: 39.2 % (ref 36.0–46.0)
Hemoglobin: 12.7 g/dL (ref 12.0–15.0)
MCH: 30.7 pg (ref 26.0–34.0)
MCHC: 32.4 g/dL (ref 30.0–36.0)
MCV: 94.7 fL (ref 78.0–100.0)
Platelets: 203 10*3/uL (ref 150–400)
RBC: 4.14 MIL/uL (ref 3.87–5.11)
RDW: 13.1 % (ref 11.5–15.5)
WBC: 14.1 10*3/uL — AB (ref 4.0–10.5)

## 2015-12-09 MED ORDER — METHOCARBAMOL 500 MG PO TABS: 500.0000 mg | ORAL_TABLET | Freq: Four times a day (QID) | ORAL | 0 refills | Status: DC | PRN

## 2015-12-09 MED ORDER — ASPIRIN 325 MG PO TBEC: 325.0000 mg | DELAYED_RELEASE_TABLET | Freq: Two times a day (BID) | ORAL | 0 refills | Status: DC

## 2015-12-09 MED ORDER — OXYCODONE HCL 5 MG PO TABS
5.0000 mg | ORAL_TABLET | ORAL | 0 refills | Status: DC | PRN
Start: 2015-12-09 — End: 2017-07-28

## 2015-12-09 NOTE — Care Management Note (Signed)
Case Management Note  Patient Details  Name: Erika Huynh MRN: [id number] Date of Birth: 09-29-1945  Subjective/Objective:    70 yr old female s/p right unicompartmental knee arthroplasty.                Action/Plan: Case manager spoke with patient concerning discharge plan. Patient will go directly to outpatient therapy. [facility] has delivered RW and CPM to her home. Patient states she will have family support at discharge.   Expected Discharge Date:   12/09/15               Expected Discharge Plan:   Home/self care  In-House Referral:  NA  Discharge planning Services  CM Consult  Post Acute Care Choice:  NA Choice offered to:  Patient  DME Arranged:  CPM, Walker rolling DME Agency:  [facility]  HH Arranged:  NA (Patient going to Outpatient therapy) [place] Agency:  NA  Status of Service:  Completed, signed off  If discussed at [place] of Stay Meetings, dates discussed:    Additional Comments:  [name], RN 12/09/2015, 11:32 AM

## 2015-12-09 NOTE — Progress Notes (Signed)
SPORTS MEDICINE AND JOINT REPLACEMENT  Lara Mulch, MD    Carlyon Shadow, PA-C [place], [place], [place]  [address]                             [phone]   PROGRESS NOTE  Subjective:  negative for Chest Pain  negative for Shortness of Breath  negative for Nausea/Vomiting   negative for Calf Pain  negative for Bowel Movement   Tolerating Diet: yes         Patient reports pain as 4 on 0-10 scale.    Objective: Vital signs in last 24 hours:   Patient Vitals for the past 24 hrs:  BP Temp Temp src Pulse Resp SpO2 Height Weight  12/09/15 0540 132/77 97.9 F (36.6 C) Oral 88 18 98 % - -  12/09/15 0105 120/63 98.1 F (36.7 C) Oral 66 16 98 % - -  12/08/15 2130 118/61 98.2 F (36.8 C) Oral 71 16 99 % - -  12/08/15 1643 131/71 98.2 F (36.8 C) Oral 62 16 98 % - -  12/08/15 1545 126/74 98 F (36.7 C) - 60 15 98 % - -  12/08/15 1530 124/69 - - 61 15 - - -  12/08/15 1515 123/83 - - 61 16 100 % - -  12/08/15 1500 128/68 - - (!) 58 13 99 % - -  12/08/15 1445 115/80 - - 64 15 96 % - -  12/08/15 1430 134/77 - - 61 11 94 % - -  12/08/15 1415 131/79 - - (!) 59 13 98 % - -  12/08/15 1400 126/74 - - 65 16 94 % - -  12/08/15 1345 119/64 97 F (36.1 C) - 75 13 93 % - -  12/08/15 1050 (!) 156/59 - - (!) 59 15 98 % - -  12/08/15 0853 129/89 98.6 F (37 C) Oral 64 20 97 % 5\' 6"  (1.676 m) 94.3 kg (207 lb 14.4 oz)    @flow {1959:LAST@   Intake/Output from previous day:   10/23 0701 - 10/24 0700 In: 2060 [P.O.:480; I.V.:1430] Out: 550 [Urine:500]   Intake/Output this shift:   No intake/output data recorded.   Intake/Output      10/23 0701 - 10/24 0700 10/24 0701 - 10/25 0700   P.O. 480    I.V. (mL/kg) 1430 (15.2)    IV Piggyback 150    Total Intake(mL/kg) 2060 (21.8)    Urine (mL/kg/hr) 500    Blood 50    Total Output 550     Net +1510          Urine Occurrence 5 x       LABORATORY DATA: No results for input(s): WBC, HGB, HCT, PLT in the last 168 hours. No  results for input(s): NA, K, CL, CO2, BUN, CREATININE, GLUCOSE, CALCIUM in the last 168 hours. No results found for: INR, PROTIME  Examination:  General appearance: alert, cooperative and no distress Extremities: extremities normal, atraumatic, no cyanosis or edema  Wound Exam: clean, dry, intact   Drainage:  None: wound tissue dry  Motor Exam: Quadriceps and Hamstrings Intact  Sensory Exam: Superficial Peroneal, Deep Peroneal and Tibial normal   Assessment:    1 Day Post-Op  Procedure(s) (LRB): UNICOMPARTMENTAL KNEE (Right)  ADDITIONAL DIAGNOSIS:  Active Problems:   S/P right unicompartmental knee replacement     Plan: Physical Therapy as ordered Weight Bearing as Tolerated (WBAT)  DVT Prophylaxis:  Aspirin  DISCHARGE PLAN: Home  DISCHARGE NEEDS: HHPT   Patient doing well, expect D/C to home today         Donia Ast 12/09/2015, 7:05 AM

## 2015-12-09 NOTE — Discharge Summary (Signed)
SPORTS MEDICINE & JOINT REPLACEMENT   Erika Mulch, MD   Erika Shadow, PA-C [place], [place], [place]  [address]                             [phone]  PATIENT ID: Erika Huynh        MRN:  [id number]          DOB/AGE: 72-13-1947 / 70 y.o.    DISCHARGE SUMMARY  ADMISSION DATE:    12/08/2015 DISCHARGE DATE:   12/09/2015   ADMISSION DIAGNOSIS: primary osteoarthritis right knee    DISCHARGE DIAGNOSIS:  primary osteoarthritis right knee    ADDITIONAL DIAGNOSIS: Active Problems:   S/P right unicompartmental knee replacement  Past Medical History:  Diagnosis Date  . Anxiety   . Arthritis   . Depression   . Headache   . Hypertension   . Hypothyroidism     PROCEDURE: Procedure(s): UNICOMPARTMENTAL KNEE on 12/08/2015  CONSULTS:    HISTORY:  See H&P in chart  HOSPITAL COURSE:  Erika Huynh is a 70 y.o. admitted on 12/08/2015 and found to have a diagnosis of primary osteoarthritis right knee.  After appropriate laboratory studies were obtained  they were taken to the operating room on 12/08/2015 and underwent Procedure(s): UNICOMPARTMENTAL KNEE.   They were given perioperative antibiotics:  Anti-infectives    Start     Dose/Rate Route Frequency Ordered Stop   12/08/15 1700  ceFAZolin (ANCEF) IVPB 1 g/50 mL premix     1 g 100 mL/hr over 30 Minutes Intravenous Every 6 hours 12/08/15 1621 12/09/15 0140   12/08/15 1039  ceFAZolin (ANCEF) IVPB 2g/100 mL premix     2 g 200 mL/hr over 30 Minutes Intravenous On call to O.R. 12/08/15 1039 12/08/15 1202    .  Patient given tranexamic acid IV or topical and exparel intra-operatively.  Tolerated the procedure well.    POD# 1: Vital signs were stable.  Patient denied Chest pain, shortness of breath, or calf pain.  Patient was started on Lovenox 30 mg subcutaneously twice daily at 8am.  Consults to PT, OT, and care management were made.  The patient was weight bearing as tolerated.  CPM was placed on the  operative leg 0-90 degrees for 6-8 hours a day. When out of the CPM, patient was placed in the foam block to achieve full extension. Incentive spirometry was taught.  Dressing was changed.       POD #2, Continued  PT for ambulation and exercise program.  IV saline locked.  O2 discontinued.    The remainder of the hospital course was dedicated to ambulation and strengthening.   The patient was discharged on 1 Day Post-Op in  Good condition.  Blood products given:none  DIAGNOSTIC STUDIES: Recent vital signs: Patient Vitals for the past 24 hrs:  BP Temp Temp src Pulse Resp SpO2 Height Weight  12/09/15 0540 132/77 97.9 F (36.6 C) Oral 88 18 98 % - -  12/09/15 0105 120/63 98.1 F (36.7 C) Oral 66 16 98 % - -  12/08/15 2130 118/61 98.2 F (36.8 C) Oral 71 16 99 % - -  12/08/15 1643 131/71 98.2 F (36.8 C) Oral 62 16 98 % - -  12/08/15 1545 126/74 98 F (36.7 C) - 60 15 98 % - -  12/08/15 1530 124/69 - - 61 15 - - -  12/08/15 1515 123/83 - - 61 16 100 % - -  12/08/15 1500 128/68 - - (!) 58 13 99 % - -  12/08/15 1445 115/80 - - 64 15 96 % - -  12/08/15 1430 134/77 - - 61 11 94 % - -  12/08/15 1415 131/79 - - (!) 59 13 98 % - -  12/08/15 1400 126/74 - - 65 16 94 % - -  12/08/15 1345 119/64 97 F (36.1 C) - 75 13 93 % - -  12/08/15 1050 (!) 156/59 - - (!) 59 15 98 % - -  12/08/15 0853 129/89 98.6 F (37 C) Oral 64 20 97 % 5\' 6"  (1.676 m) 94.3 kg (207 lb 14.4 oz)       Recent laboratory studies: No results for input(s): WBC, HGB, HCT, PLT in the last 168 hours. No results for input(s): NA, K, CL, CO2, BUN, CREATININE, GLUCOSE, CALCIUM in the last 168 hours. No results found for: INR, PROTIME   Recent Radiographic Studies :  No results found.  DISCHARGE INSTRUCTIONS: Discharge Instructions    CPM    Complete by:  As directed    Continuous passive motion machine (CPM):      Use the CPM from 0 to 90 for 4-6 hours per day.      You may increase by 10 per day.  You may break it up  into 2 or 3 sessions per day.      Use CPM for 2 weeks or until you are told to stop.   Call MD / Call 911    Complete by:  As directed    If you experience chest pain or shortness of breath, CALL 911 and be transported to the hospital emergency room.  If you develope a fever above 101 F, pus (white drainage) or increased drainage or redness at the wound, or calf pain, call your surgeon's office.   Constipation Prevention    Complete by:  As directed    Drink plenty of fluids.  Prune juice may be helpful.  You may use a stool softener, such as Colace (over the counter) 100 mg twice a day.  Use MiraLax (over the counter) for constipation as needed.   Diet - low sodium heart healthy    Complete by:  As directed    Discharge instructions    Complete by:  As directed    INSTRUCTIONS AFTER JOINT REPLACEMENT   Remove items at home which could result in a fall. This includes throw rugs or furniture in walking pathways ICE to the affected joint every three hours while awake for 30 minutes at a time, for at least the first 3-5 days, and then as needed for pain and swelling.  Continue to use ice for pain and swelling. You may notice swelling that will progress down to the foot and ankle.  This is normal after surgery.  Elevate your leg when you are not up walking on it.   Continue to use the breathing machine you got in the hospital (incentive spirometer) which will help keep your temperature down.  It is common for your temperature to cycle up and down following surgery, especially at night when you are not up moving around and exerting yourself.  The breathing machine keeps your lungs expanded and your temperature down.   DIET:  As you were doing prior to hospitalization, we recommend a well-balanced diet.  DRESSING / WOUND CARE / SHOWERING  Keep the surgical dressing until follow up.  The dressing is water proof, so you can shower  without any extra covering.  IF THE DRESSING FALLS OFF or the wound  gets wet inside, change the dressing with sterile gauze.  Please use good hand washing techniques before changing the dressing.  Do not use any lotions or creams on the incision until instructed by your surgeon.    ACTIVITY  Increase activity slowly as tolerated, but follow the weight bearing instructions below.   No driving for 6 weeks or until further direction given by your physician.  You cannot drive while taking narcotics.  No lifting or carrying greater than 10 lbs. until further directed by your surgeon. Avoid periods of inactivity such as sitting longer than an hour when not asleep. This helps prevent blood clots.  You may return to work once you are authorized by your doctor.     WEIGHT BEARING   Weight bearing as tolerated with assist device (walker, cane, etc) as directed, use it as long as suggested by your surgeon or therapist, typically at least 4-6 weeks.   EXERCISES  Results after joint replacement surgery are often greatly improved when you follow the exercise, range of motion and muscle strengthening exercises prescribed by your doctor. Safety measures are also important to protect the joint from further injury. Any time any of these exercises cause you to have increased pain or swelling, decrease what you are doing until you are comfortable again and then slowly increase them. If you have problems or questions, call your caregiver or physical therapist for advice.   Rehabilitation is important following a joint replacement. After just a few days of immobilization, the muscles of the leg can become weakened and shrink (atrophy).  These exercises are designed to build up the tone and strength of the thigh and leg muscles and to improve motion. Often times heat used for twenty to thirty minutes before working out will loosen up your tissues and help with improving the range of motion but do not use heat for the first two weeks following surgery (sometimes heat can increase  post-operative swelling).   These exercises can be done on a training (exercise) mat, on the floor, on a table or on a bed. Use whatever works the best and is most comfortable for you.    Use music or television while you are exercising so that the exercises are a pleasant break in your day. This will make your life better with the exercises acting as a break in your routine that you can look forward to.   Perform all exercises about fifteen times, three times per day or as directed.  You should exercise both the operative leg and the other leg as well.   Exercises include:   Quad Sets - Tighten up the muscle on the front of the thigh (Quad) and hold for 5-10 seconds.   Straight Leg Raises - With your knee straight (if you were given a brace, keep it on), lift the leg to 60 degrees, hold for 3 seconds, and slowly lower the leg.  Perform this exercise against resistance later as your leg gets stronger.  Leg Slides: Lying on your back, slowly slide your foot toward your buttocks, bending your knee up off the floor (only go as far as is comfortable). Then slowly slide your foot back down until your leg is flat on the floor again.  Angel Wings: Lying on your back spread your legs to the side as far apart as you can without causing discomfort.  Hamstring Strength:  Lying on your back,  push your heel against the floor with your leg straight by tightening up the muscles of your buttocks.  Repeat, but this time bend your knee to a comfortable angle, and push your heel against the floor.  You may put a pillow under the heel to make it more comfortable if necessary.   A rehabilitation program following joint replacement surgery can speed recovery and prevent re-injury in the future due to weakened muscles. Contact your doctor or a physical therapist for more information on knee rehabilitation.    CONSTIPATION  Constipation is defined medically as fewer than three stools per week and severe constipation as  less than one stool per week.  Even if you have a regular bowel pattern at home, your normal regimen is likely to be disrupted due to multiple reasons following surgery.  Combination of anesthesia, postoperative narcotics, change in appetite and fluid intake all can affect your bowels.   YOU MUST use at least one of the following options; they are listed in order of increasing strength to get the job done.  They are all available over the counter, and you may need to use some, POSSIBLY even all of these options:    Drink plenty of fluids (prune juice may be helpful) and high fiber foods Colace 100 mg by mouth twice a day  Senokot for constipation as directed and as needed Dulcolax (bisacodyl), take with full glass of water  Miralax (polyethylene glycol) once or twice a day as needed.  If you have tried all these things and are unable to have a bowel movement in the first 3-4 days after surgery call either your surgeon or your primary doctor.    If you experience loose stools or diarrhea, hold the medications until you stool forms back up.  If your symptoms do not get better within 1 week or if they get worse, check with your doctor.  If you experience "the worst abdominal pain ever" or develop nausea or vomiting, please contact the office immediately for further recommendations for treatment.   ITCHING:  If you experience itching with your medications, try taking only a single pain pill, or even half a pain pill at a time.  You can also use Benadryl over the counter for itching or also to help with sleep.   TED HOSE STOCKINGS:  Use stockings on both legs until for at least 2 weeks or as directed by physician office. They may be removed at night for sleeping.  MEDICATIONS:  See your medication summary on the "After Visit Summary" that nursing will review with you.  You may have some home medications which will be placed on hold until you complete the course of blood thinner medication.  It is  important for you to complete the blood thinner medication as prescribed.  PRECAUTIONS:  If you experience chest pain or shortness of breath - call 911 immediately for transfer to the hospital emergency department.   If you develop a fever greater that 101 F, purulent drainage from wound, increased redness or drainage from wound, foul odor from the wound/dressing, or calf pain - CONTACT YOUR SURGEON.                                                   FOLLOW-UP APPOINTMENTS:  If you do not already have a post-op appointment, please call the  office for an appointment to be seen by your surgeon.  Guidelines for how soon to be seen are listed in your "After Visit Summary", but are typically between 1-4 weeks after surgery.  OTHER INSTRUCTIONS:   Knee Replacement:  Do not place pillow under knee, focus on keeping the knee straight while resting. CPM instructions: 0-90 degrees, 2 hours in the morning, 2 hours in the afternoon, and 2 hours in the evening. Place foam block, curve side up under heel at all times except when in CPM or when walking.  DO NOT modify, tear, cut, or change the foam block in any way.  MAKE SURE YOU:  Understand these instructions.  Get help right away if you are not doing well or get worse.    Thank you for letting us be a part of your medical care team.  It is a privilege we respect greatly.  We hope these instructions will help you stay on track for a fast and full recovery!   Increase activity slowly as tolerated    Complete by:  As directed       DISCHARGE MEDICATIONS:     Medication List    TAKE these medications   acetaminophen 500 MG tablet Commonly known as:  TYLENOL Take 1,000 mg by mouth every 6 (six) hours as needed (for pain.).   aspirin 325 MG EC tablet Take 1 tablet (325 mg total) by mouth 2 (two) times daily.   escitalopram 20 MG tablet Commonly known as:  LEXAPRO Take 20 mg by mouth daily.   losartan 100 MG tablet Commonly known as:   COZAAR Take 100 mg by mouth daily.   meclizine 25 MG tablet Commonly known as:  ANTIVERT Take 25 mg by mouth 3 (three) times daily as needed for dizziness.   Melatonin 10 MG Caps Take 10 mg by mouth at bedtime as needed (for sleep.).   methocarbamol 500 MG tablet Commonly known as:  ROBAXIN Take 1-2 tablets (500-1,000 mg total) by mouth every 6 (six) hours as needed for muscle spasms.   oxyCODONE 5 MG immediate release tablet Commonly known as:  Oxy IR/ROXICODONE Take 1-2 tablets (5-10 mg total) by mouth every 3 (three) hours as needed for breakthrough pain.   SYNTHROID 175 MCG tablet Generic drug:  levothyroxine Take 175 mcg by mouth daily.       FOLLOW UP VISIT:    DISPOSITION: HOME VS. SNF  CONDITION:  Good   Donia Ast 12/09/2015, 7:08 AM

## 2015-12-09 NOTE — Evaluation (Signed)
Occupational Therapy Evaluation and Discharge Patient Details Name: Erika Huynh MRN: [id number] DOB: 1945-09-29 Today's Date: 12/09/2015    History of Present Illness 70 y.o. female admitted to [facility] on 12/08/15 for elective R unicompartmental TKA.  Pt with significant PMHx of HTN, HA, and depression/anxiety.   Clinical Impression   PTA Pt independent in ADL and mobility. Pt currently mod I for ADL, and mobility with RW. Pt benefits from vc for safe hand placement during transitions sit to stand and safety with RW. No further OT needs at this time.     Follow Up Recommendations  No OT follow up;Supervision - Intermittent    Equipment Recommendations  None recommended by OT    Recommendations for Other Services       Precautions / Restrictions Precautions Precautions: Knee Precaution Booklet Issued: Yes (comment) Precaution Comments: HEP given by PT previously Restrictions Weight Bearing Restrictions: Yes RLE Weight Bearing: Weight bearing as tolerated      Mobility Bed Mobility Overal bed mobility: Modified Independent             General bed mobility comments: HOB elevated and pt using railing for leverage  Transfers Overall transfer level: Needs assistance Equipment used: Rolling walker (2 wheeled) Transfers: Sit to/from Stand Sit to Stand: Min guard         General transfer comment: Min guard for safety and and cues for keeping within the RW (Pt unfamiliar with how to use with ADL).  Verbal cues for safe hand placement during transitions.    Balance Overall balance assessment: Needs assistance Sitting-balance support: No upper extremity supported;Feet supported Sitting balance-Leahy Scale: Good     Standing balance support: No upper extremity supported;During functional activity Standing balance-Leahy Scale: Fair Standing balance comment: standing at sink for ADL                            ADL Overall ADL's : Modified independent                                       General ADL Comments: Pt able to ambulate to bathroom with RW, perform toilet transfer and hygeine, sink level ADL (wash cloth to wash upper and lower body) oral care, brushing hair, applying deoderant, got fully dressed sit to stand.      Vision     Perception     Praxis      Pertinent Vitals/Pain Pain Assessment: 0-10 Pain Score: 3  Pain Location: Right knee Pain Descriptors / Indicators: Sore Pain Intervention(s): Monitored during session;Repositioned;Ice applied     Hand Dominance Right   Extremity/Trunk Assessment Upper Extremity Assessment Upper Extremity Assessment: Overall WFL for tasks assessed   Lower Extremity Assessment Lower Extremity Assessment: RLE deficits/detail RLE Deficits / Details: deficits in ROM and strength post-op   Cervical / Trunk Assessment Cervical / Trunk Assessment: Normal   Communication Communication Communication: No difficulties   Cognition Arousal/Alertness: Awake/alert Behavior During Therapy: WFL for tasks assessed/performed Overall Cognitive Status: Within Functional Limits for tasks assessed                     General Comments       Exercises       Shoulder Instructions      Home Living Family/patient expects to be discharged to:: Private residence Living Arrangements: Spouse/significant other Available  Help at Discharge: Family;Available 24 hours/day Type of Home: House Home Access: Stairs to enter [employer] of Steps: 2 Entrance Stairs-Rails: None Home Layout: Two level Alternate Level Stairs-Number of Steps: 7 Alternate Level Stairs-Rails: Right Bathroom Shower/Tub: Tub/shower unit Shower/tub characteristics: Architectural technologist: Standard Bathroom Accessibility: Yes How Accessible: Accessible via walker Home Equipment: [place] - 2 wheels;Shower seat   Additional Comments: Daughter works at [employer], and will be helping provide assitance  at home in addition to her husband      Prior Functioning/Environment Level of Independence: Independent                 OT Problem List: Decreased range of motion;Decreased strength;Impaired balance (sitting and/or standing);Decreased knowledge of use of DME or AE;Pain   OT Treatment/Interventions:      OT Goals(Current goals can be found in the care plan section) Acute Rehab OT Goals Patient Stated Goal: To get home today OT Goal Formulation: With patient Time For Goal Achievement: 12/16/15 Potential to Achieve Goals: Good  OT Frequency:     Barriers to D/C:            Co-evaluation              End of Session Equipment Utilized During Treatment: Rolling walker CPM Right Knee CPM Right Knee: Off Additional Comments: OT removed CPM at beginning of session, Pt had been using for 2 hours Nurse Communication: Mobility status  Activity Tolerance: Patient tolerated treatment well Patient left: in chair;with call bell/phone within reach   Time: 0835-0905 OT Time Calculation (min): 30 min Charges:  OT General Charges $OT Visit: 1 Procedure OT Evaluation $OT Eval Low Complexity: 1 Procedure OT Treatments $Self Care/Home Management : 8-22 mins G-Codes: OT G-codes **NOT FOR INPATIENT CLASS** Functional Assessment Tool Used: Clinical Judgement Functional Limitation: Self care Self Care Current Status [id number]): At least 1 percent but less than 20 percent impaired, limited or restricted Self Care Goal Status [id number]): 0 percent impaired, limited or restricted Self Care Discharge Status [phone]): At least 1 percent but less than 20 percent impaired, limited or restricted  Jaci Carrel 12/09/2015, 9:23 AM  Hulda Humphrey OTR/L [phone]

## 2015-12-09 NOTE — Progress Notes (Signed)
Physical Therapy Treatment Patient Details Name: Erika Huynh MRN: [id number] DOB: [birth year] Today's Date: 12/09/2015    History of Present Illness 70 y.o. female admitted to [facility] on 12/08/15 for elective R unicompartmental TKA.  Pt with significant PMHx of HTN, HA, and depression/anxiety.    PT Comments    Pt is making progress toward all goals.  Pt currently supervision with gait and stairs with RW.  Informed nurse that pt is ready to discharge.  Will follow up in P.M. If pt remains hospitalized.  .    Follow Up Recommendations        Equipment Recommendations       Recommendations for Other Services       Precautions / Restrictions Precautions Precautions: Knee Precaution Booklet Issued: Yes (comment) Precaution Comments: HEP given by PT previously Restrictions Weight Bearing Restrictions: Yes RLE Weight Bearing: Weight bearing as tolerated    Mobility  Bed Mobility Overal bed mobility: Modified Independent             General bed mobility comments: Pt. used railing to reposition herself in bed.  Transfers Overall transfer level: Needs assistance Equipment used: Rolling walker (2 wheeled) Transfers: Sit to/from Stand Sit to Stand: Supervision         General transfer comment: Pt required cues for hand placement to reach back for seated surface.  Ambulation/Gait Ambulation/Gait assistance: Supervision Ambulation Distance (Feet): 250 Feet Assistive device: Rolling walker (2 wheeled) Gait Pattern/deviations: Step-through pattern;Antalgic     General Gait Details: antalgic gait lacking terminal knee extension. Pt. required VCs for keeping withing RW and using it for support, so adjusted height of walker.  After adjustment of walker, pt. was modified independent.   Stairs Stairs: Yes Stairs assistance: Supervision Stair Management: One rail Right;Step to pattern;Forwards;Sideways (forward to ascend and sideways to descend) Number of Stairs:  8 General stair comments: Instructed pt. on technique for ascending/descendig stairs.  Pt. required VCs for proper sequencing and form.  Pt. unable to descend stairs in a forward postition yet.  Descended turned slightly to the side.  Wheelchair Mobility    Modified Rankin (Stroke Patients Only)       Balance Overall balance assessment: Modified Independent Sitting-balance support: No upper extremity supported;Feet supported Sitting balance-Leahy Scale: Good     Standing balance support: No upper extremity supported;During functional activity Standing balance-Leahy Scale: Fair Standing balance comment: standing at sink for ADL                    Cognition Arousal/Alertness: Awake/alert Behavior During Therapy: WFL for tasks assessed/performed Overall Cognitive Status: Within Functional Limits for tasks assessed                      Exercises Total Joint Exercises Ankle Circles/Pumps: AROM;Both;10 reps;Supine Quad Sets: AROM;Right;10 reps;Supine Towel Squeeze: AROM;Both;10 reps;Supine Short Arc Quad: AROM;Right;10 reps;Supine Heel Slides: AROM;Right;10 reps;Supine Hip ABduction/ADduction: AROM;Right;Supine;10 reps Straight Leg Raises: AROM;Right;10 reps;Supine Long Arc Quad: AROM;Right;10 reps;Seated Knee Flexion: AROM;Right;10 reps;Seated Goniometric ROM: 98 degress of R knee flexion    General Comments        Pertinent Vitals/Pain Pain Assessment: No/denies pain Pain Score: 0-No pain Pain Location: Pt. rated her pain in her R knee as 0/10 today. Pain Descriptors / Indicators: Sore Pain Intervention(s): Monitored during session;Repositioned;Ice applied    Home Living Family/patient expects to be discharged to:: Private residence Living Arrangements: Spouse/significant other Available Help at Discharge: Family;Available 24 hours/day Type of Home: [address]  Access: Stairs to enter Entrance Stairs-Rails: None Home Layout: Two level Home  Equipment: Walker - 2 wheels;Shower seat Additional Comments: Daughter works at [employer], and will be helping provide assitance at home in addition to her husband    Prior Function Level of Independence: Independent          PT Goals (current goals can now be found in the care plan section) Acute Rehab PT Goals Patient Stated Goal: To get home today Potential to Achieve Goals: Good Progress towards PT goals: Progressing toward goals    Frequency           PT Plan      Co-evaluation             End of Session Equipment Utilized During Treatment: Gait belt Activity Tolerance: Patient tolerated treatment well Patient left: in bed;with call bell/phone within reach     Time: 0929-1000 PT Time Calculation (min) (ACUTE ONLY): 31 min  Charges:  $Gait Training: 8-22 mins $Therapeutic Exercise: 8-22 mins                    G Codes:      Bary Castilla Dec 29, 2015, 10:53 AM

## 2015-12-09 NOTE — Care Management Obs Status (Signed)
[facility] NOTIFICATION   Patient Details  Name: Erika Huynh MRN: [id number] Date of Birth: 12-08-1945   Medicare Observation Status Notification Given:  Other (see comment) Patient discharged prior to CM seeing her.   [name], RN 12/09/2015, 3:54 PM

## 2015-12-09 NOTE — Op Note (Addendum)
UNI KNEE REPLACEMENT OPERATIVE NOTE:  12/08/2015  3:18 PM  PATIENT:  Erika Huynh  70 y.o. female  PRE-OPERATIVE DIAGNOSIS:  primary osteoarthritis right knee  POST-OPERATIVE DIAGNOSIS:  primary osteoarthritis right knee  PROCEDURE:  Procedure(s): UNICOMPARTMENTAL KNEE  SURGEON:  Surgeon(s): Vickey Huger, MD  PHYSICIAN ASSISTANT: Buck Mam, PAC   ANESTHESIA:   spinal  DRAINS: Hemovac  SPECIMEN: None  COUNTS:  Correct  TOURNIQUET:   Total Tourniquet Time Documented: Thigh (Right) - 48 minutes Total: Thigh (Right) - 48 minutes   DICTATION:  Indication for procedure:    The patient is a 70 y.o. female who has failed conservative treatment for primary osteoarthritis right knee.  Informed consent was obtained prior to anesthesia. The risks versus benefits of the operation were explain and in a way the patient can, and did, understand.   On the implant demand matching protocol, this patient scored 10.  Therefore, this patient was not receive a polyethylene insert with vitamin E which is a high demand implant.  Description of procedure:     The patient was taken to the operating room and placed under anesthesia.  The patient was positioned in the usual fashion taking care that all body parts were adequately padded and/or protected.  I foley catheter was not placed.  A tourniquet was applied and the leg prepped and draped in the usual sterile fashion.  The extremity was exsanguinated with the esmarch and tourniquet inflated to 350 mmHg.  Pre-operative range of motion was normal.  The knee was in 5 degree of mild varus.  A midline incision approximately 3-4 inches long was made with a #10 blade.  A new blade was used to make a parapatellar arthrotomy going 1 cm into the quadriceps tendon, over the patella, and alongside the medial aspect of the patellar tendon.  A synovectomy was then performed with the #10 blade and forceps. I then elevated the deep MCL off the medial  tibial flare. The knee was put at 90 degrees and the patient specific cutting blocks were used to make our proximal tibial cut and distal femoral cut. The medial meniscus was removed at this point.  I then used the 2 cutting guide on the femur to drill for lugs and cut the chamfers. Likewise, a size 1 tibial baseplate was used to prepare the tibia. I then trialed the size 2 femur and size 1 tibia. I trialed several poly inserts and a 9 mm achieved good balance in flexion and extension.  I then irrigated copiously and then mixed the cement. I injected exparel in the deep soft tissues at this point. I then cemented the tibia first followed by the femur and removed excess cement and then inserted the polyethylene. I placed the leg in extension and finished injecting the rest of the exparel.  BLOOD LOSS:  300cc DRAINS: 1 hemovac, 1 pain catheter COMPLICATIONS:  None.  PLAN OF CARE: Admit to inpatient   PATIENT DISPOSITION:  PACU - hemodynamically stable.   Delay start of Pharmacological VTE agent (>24hrs) due to surgical blood loss or risk of bleeding:  not applicable  Please fax a copy of this op note to my office at [phone] (please only include page 1 and 2 of the Case Information op note)

## 2017-05-09 ENCOUNTER — Ambulatory Visit: Payer: Medicare Other | Admitting: Podiatry

## 2017-05-09 DIAGNOSIS — L600 Ingrowing nail: Secondary | ICD-10-CM

## 2017-05-09 NOTE — Patient Instructions (Addendum)

## 2017-05-09 NOTE — Progress Notes (Signed)
Reports pain to the inside of the left great toenail for several weeks.  Denies treatment.  States that the nail is discolored and thick  Subjective:  Patient ID: Erika Huynh, female    DOB: November 14, 1945,  MRN: [id number]  Chief Complaint  Patient presents with  . Nail Problem    L great toenail (small piece thats sticking out)  medial border and nail discoloration and thickening x several weeks Tx: none    72 y.o. female returns for the above complaint.  Reports pain to the left great toenail on the inside.  Pain present for several weeks.  Denies prior treatment.  States that the nail is discolored and thick.  Objective:  There were no vitals filed for this visit. General AA&O x3. Normal mood and affect.  Vascular Pedal pulses palpable.  Neurologic Epicritic sensation grossly intact.  Dermatologic No open lesions. Skin normal texture and turgor.  Left hallux nail with thickening, yellow discoloration, transverse ridging, ingrowing at medial aspect with pain to palpation  Orthopedic:  Pain to palpation about the left great toenail   Assessment & Plan:  Patient was evaluated and treated and all questions answered.  Left great toenail ingrown nail -Deep distal ingrown nail, patient could not tolerate removal of the nail.  Medial border of the great toenail anesthetized with 1 cc of lidocaine plain. Slant back of the nail performed with a nail clipper.  Area dressed with Intermedic ointment and a Band-Aid.  Patient tolerated procedure well.  Return in about 1 month (around 06/06/2017) for nail check.

## 2017-07-28 ENCOUNTER — Encounter: Payer: Self-pay | Admitting: Allergy and Immunology

## 2017-07-28 ENCOUNTER — Ambulatory Visit (INDEPENDENT_AMBULATORY_CARE_PROVIDER_SITE_OTHER): Payer: Medicare Other | Admitting: Allergy and Immunology

## 2017-07-28 VITALS — BP 146/80 | HR 68 | Temp 98.4°F | Resp 20 | Ht 65.0 in | Wt 199.2 lb

## 2017-07-28 DIAGNOSIS — T485X1A Poisoning by other anti-common-cold drugs, accidental (unintentional), initial encounter: Secondary | ICD-10-CM | POA: Diagnosis not present

## 2017-07-28 DIAGNOSIS — H1045 Other chronic allergic conjunctivitis: Secondary | ICD-10-CM | POA: Diagnosis not present

## 2017-07-28 DIAGNOSIS — J310 Chronic rhinitis: Secondary | ICD-10-CM

## 2017-07-28 DIAGNOSIS — T485X5A Adverse effect of other anti-common-cold drugs, initial encounter: Secondary | ICD-10-CM

## 2017-07-28 DIAGNOSIS — J3089 Other allergic rhinitis: Secondary | ICD-10-CM

## 2017-07-28 DIAGNOSIS — H1013 Acute atopic conjunctivitis, bilateral: Secondary | ICD-10-CM

## 2017-07-28 MED ORDER — OLOPATADINE HCL 0.2 % OP SOLN: OPHTHALMIC | 5 refills | Status: DC

## 2017-07-28 MED ORDER — METHYLPREDNISOLONE ACETATE 80 MG/ML IJ SUSP
80.0000 mg | Freq: Once | INTRAMUSCULAR | Status: AC
  Administered 2017-07-28: 80 mg via INTRAMUSCULAR

## 2017-07-28 MED ORDER — AZELASTINE HCL 0.15 % NA SOLN: 2.0000 | Freq: Every day | NASAL | 5 refills | Status: DC

## 2017-07-28 MED ORDER — MONTELUKAST SODIUM 10 MG PO TABS: 10.0000 mg | ORAL_TABLET | Freq: Every day | ORAL | 5 refills | Status: DC

## 2017-07-28 MED ORDER — OLOPATADINE HCL 0.2 % OP SOLN: 1.0000 [drp] | OPHTHALMIC | 5 refills | Status: DC

## 2017-07-28 NOTE — Patient Instructions (Addendum)
  1.  Allergen avoidance measures  2.  Every night utilize the following medications:    A.  OTC Nasacort -1 spray each nostril  B. Azelastine -1 spray each nostril  C.  OTC nasal decongestant -1 spray alternating nostril  D.  Montelukast 10 mg -1 tablet  3.  Every morning utilize the following medications:   A.  OTC Nasacort -1 spray each nostril  B. Azelastine -1 spray each nostril  4.  If needed:   A.  Nasal saline  B.  Pataday 1 drop each eye twice a day  C.  Cetirizine 10 mg 1 tablet 1 time per day  5.  Depo-Medrol 80 IM delivered in clinic today  6. Return to clinic in 3 weeks or earlier if problem

## 2017-07-28 NOTE — Progress Notes (Signed)
Dear Dr. Dema Severin,  Thank you for referring Erika Huynh to the [facility] on 07/28/2017.   Below is a summation of this patient's evaluation and recommendations.  Thank you for your referral. I will keep you informed about this patient's response to treatment.   If you have any questions please do not hesitate to contact me.   Sincerely,  Jiles Prows, MD Allergy / Immunology [facility]   ______________________________________________________________________    NEW PATIENT NOTE  Referring Provider: Harlan Stains, MD Primary Provider: Harlan Stains, MD Date of office visit: 07/28/2017    Subjective:   Chief Complaint:  Erika Huynh (DOB: Aug 23, 1945) is a 72 y.o. female who presents to the clinic on 07/28/2017 with a chief complaint of Nasal Congestion (sneezing, runny nose, itchy eyes) .     HPI: She has a long history of allergic disease dating back to her childhood and early teens for which she underwent immunotherapy during her 32s which helped significantly regarding issues with her nose and eyes.  She did well for decades but over the course of the past 4 to 6 months she has redeveloped very significant nasal congestion and sneezing and itchy eyes especially her left eye.  She has lots of watering from her left eye.  She cannot really smell food very well.  She does not really have a history of ugly nasal discharge.  She is not really sure what has caused her allergies to flareup.  She has not had a significant environmental change that may account for this issue.  She has been using Neo-Synephrine at bedtime every night for months.  Various treatments including antihistamines and Sudafed and Flonase have not helped her.  She does not have any other associated atopic disease.  Past Medical History:  Diagnosis Date  . Anxiety   . Arthritis   . Depression   . Headache   .  Hypertension   . Hypothyroidism     Past Surgical History:  Procedure Laterality Date  . ABDOMINAL HYSTERECTOMY    . ADENOIDECTOMY    . BREAST SURGERY     cyst removed  . PARTIAL KNEE ARTHROPLASTY Right 12/08/2015   Procedure: UNICOMPARTMENTAL KNEE;  Surgeon: Vickey Huger, MD;  Location: [facility];  Service: Orthopedics;  Laterality: Right;  . TONSILLECTOMY      Allergies as of 07/28/2017      Reactions   Demerol [meperidine] Nausea Only, Other (See Comments)   Can not eat, no appetite   Propoxyphene Nausea Only, Other (See Comments)   NO APPETITE CAN NOT EAT      Medication List      acetaminophen 500 MG tablet Commonly known as:  TYLENOL Take 1,000 mg by mouth every 6 (six) hours as needed (for pain.).   aspirin 325 MG EC tablet Take 1 tablet (325 mg total) by mouth 2 (two) times daily.   escitalopram 20 MG tablet Commonly known as:  LEXAPRO Take 20 mg by mouth daily.   loratadine 10 MG tablet Commonly known as:  CLARITIN Take 10 mg by mouth daily.   losartan 100 MG tablet Commonly known as:  COZAAR Take 100 mg by mouth daily.   meclizine 25 MG tablet Commonly known as:  ANTIVERT Take 25 mg by mouth 3 (three) times daily as needed for dizziness.   Melatonin 10 MG Caps Take 10 mg by mouth at bedtime as needed (  for sleep.).   NEO-SYNEPHRINE 12 HOUR SPRAY 0.05 % nasal spray Generic drug:  oxymetazoline Place 1 spray into both nostrils at bedtime.   SYNTHROID 175 MCG tablet Generic drug:  levothyroxine Take 175 mcg by mouth daily.       Review of systems negative except as noted in HPI / PMHx or noted below:  Review of Systems  Constitutional: Negative.   HENT: Negative.   Eyes: Negative.   Respiratory: Negative.   Cardiovascular: Negative.   Gastrointestinal: Negative.   Genitourinary: Negative.   Musculoskeletal: Negative.   Skin: Negative.   Neurological: Negative.   Endo/Heme/Allergies: Negative.   Psychiatric/Behavioral: Negative.      Family History  Problem Relation Age of Onset  . Lung cancer Father   . Breast cancer Maternal Aunt   . Throat cancer Maternal Uncle   . Pancreatic cancer Maternal Grandmother     Social History   Socioeconomic History  . Marital status: Married    Spouse name: Not on file  . Number of children: Not on file  . Years of education: Not on file  . Highest education level: Not on file  Occupational History  . Not on file  Social Needs  . Financial resource strain: Not on file  . Food insecurity:    Worry: Not on file    Inability: Not on file  . Transportation needs:    Medical: Not on file    Non-medical: Not on file  Tobacco Use  . Smoking status: Never Smoker  . Smokeless tobacco: Never Used  Substance and Sexual Activity  . Alcohol use: Yes    Alcohol/week: 0.0 oz    Comment: occas.  . Drug use: No  . Sexual activity: Not on file  Lifestyle  . Physical activity:    Days per week: Not on file    Minutes per session: Not on file  . Stress: Not on file  Relationships  . Social connections:    Talks on phone: Not on file    Gets together: Not on file    Attends religious service: Not on file    Active member of club or organization: Not on file    Attends meetings of clubs or organizations: Not on file    Relationship status: Not on file  . Intimate partner violence:    Fear of current or ex partner: Not on file    Emotionally abused: Not on file    Physically abused: Not on file    Forced sexual activity: Not on file  Other Topics Concern  . Not on file  Social History Narrative  . Not on file    Environmental and Social history  Lives in a house with a dry environment, a cat and dog located inside the household, no carpet in the bedroom, no plastic on the bed, no plastic on the pillow, and no smokers located inside the household.  Objective:   Vitals:   07/28/17 1403  BP: (!) 146/80  Pulse: 68  Resp: 20  Temp: 98.4 F (36.9 C)   Height: 5'  5" (165.1 cm) Weight: 199 lb 3.2 oz (90.4 kg)  Physical Exam  Constitutional:  Nasal voice  HENT:  Head: Normocephalic. Head is without right periorbital erythema and without left periorbital erythema.  Right Ear: Tympanic membrane, external ear and ear canal normal.  Left Ear: Tympanic membrane, external ear and ear canal normal.  Nose: Mucosal edema (Massive turbinate swelling) present. No rhinorrhea.  Mouth/Throat: Oropharynx is  clear and moist and mucous membranes are normal. No oropharyngeal exudate.  Eyes: Pupils are equal, round, and reactive to light. Conjunctivae and lids are normal.  Neck: Trachea normal. No tracheal deviation present. No thyromegaly present.  Cardiovascular: Normal rate, regular rhythm, S1 normal, S2 normal and normal heart sounds.  No murmur heard. Pulmonary/Chest: Effort normal. No stridor. No respiratory distress. She has no wheezes. She has no rales. She exhibits no tenderness.  Abdominal: Soft. She exhibits no distension and no mass. There is no hepatosplenomegaly. There is no tenderness. There is no rebound and no guarding.  Musculoskeletal: She exhibits no edema or tenderness.  Lymphadenopathy:       Head (right side): No tonsillar adenopathy present.       Head (left side): No tonsillar adenopathy present.    She has no cervical adenopathy.    She has no axillary adenopathy.  Neurological: She is alert.  Skin: No rash noted. She is not diaphoretic. No erythema. No pallor. Nails show no clubbing.    Diagnostics: Allergy skin tests were performed.  She demonstrated hypersensitivity against grass and house dust mite  Assessment and Plan:    1. Perennial allergic rhinitis   2. Perennial allergic conjunctivitis of both eyes   3. Rhinitis medicamentosa     1.  Allergen avoidance measures  2.  Every night utilize the following medications:    A.  OTC Nasacort -1 spray each nostril  B. Azelastine -1 spray each nostril  C.  OTC nasal decongestant  -1 spray alternating nostril  D.  Montelukast 10 mg -1 tablet  3.  Every morning utilize the following medications:   A.  OTC Nasacort -1 spray each nostril  B. Azelastine -1 spray each nostril  4.  If needed:   A.  Nasal saline  B.  Pataday 1 drop each eye twice a day  C.  Cetirizine 10 mg 1 tablet 1 time per day  5.  Depo-Medrol 80 IM delivered in clinic today  6. Return to clinic in 3 weeks or earlier if problem  Adelma appears to have a combination of allergic rhinoconjunctivitis and rhinitis medicamentosa and we will treat her with a combination of therapy noted above.  For now we will have her continue a nasal decongestant spray alternating nostril day to day only at nighttime in conjunction with a nasal steroid which should prevent the development of significant rebound occurring.  Hopefully when she eliminates all of her inflammation we will be able to completely eliminate the use of her nasal decongestant spray.  I will see her back in this clinic in 3 weeks or earlier if there is a problem.  Jiles Prows, MD Allergy / Immunology [facility]

## 2017-08-01 ENCOUNTER — Encounter: Payer: Self-pay | Admitting: Allergy and Immunology

## 2017-08-22 ENCOUNTER — Encounter: Payer: Self-pay | Admitting: Allergy and Immunology

## 2017-08-22 ENCOUNTER — Ambulatory Visit: Payer: Medicare Other | Admitting: Allergy and Immunology

## 2017-08-22 VITALS — BP 112/68 | HR 95 | Resp 18

## 2017-08-22 DIAGNOSIS — T485X1A Poisoning by other anti-common-cold drugs, accidental (unintentional), initial encounter: Secondary | ICD-10-CM | POA: Diagnosis not present

## 2017-08-22 DIAGNOSIS — H1013 Acute atopic conjunctivitis, bilateral: Secondary | ICD-10-CM

## 2017-08-22 DIAGNOSIS — T485X5A Adverse effect of other anti-common-cold drugs, initial encounter: Secondary | ICD-10-CM

## 2017-08-22 DIAGNOSIS — J310 Chronic rhinitis: Secondary | ICD-10-CM | POA: Diagnosis not present

## 2017-08-22 DIAGNOSIS — H1045 Other chronic allergic conjunctivitis: Secondary | ICD-10-CM

## 2017-08-22 DIAGNOSIS — J3089 Other allergic rhinitis: Secondary | ICD-10-CM

## 2017-08-22 NOTE — Progress Notes (Signed)
Follow-up Note  Referring Provider: Harlan Stains, MD Primary Provider: Harlan Stains, MD Date of Office Visit: 08/22/2017  Subjective:   Erika Huynh (DOB: [birth year]) is a 72 y.o. female who returns to the [facility] on 08/22/2017 in re-evaluation of the following:  HPI: Erika Huynh returns to this clinic in reevaluation of her allergic rhinoconjunctivitis and rhinitis medicamentosa.  Her last visit to this clinic was her initial evaluation of 28 July 2017.  She has resolved all the issues with her nasal congestion and sneezing and itchy eyes and feels great.  She has felt so good that she has stopped all her nasal sprays and is just using montelukast.  She has eliminated all of her over-the-counter nasal decongestant spray.  She has performed house dust avoidance measures.  Allergies as of 08/22/2017      Reactions   Demerol [meperidine] Nausea Only, Other (See Comments)   Can not eat, no appetite   Propoxyphene Nausea Only, Other (See Comments)   NO APPETITE CAN NOT EAT      Medication List      acetaminophen 500 MG tablet Commonly known as:  TYLENOL Take 1,000 mg by mouth every 6 (six) hours as needed (for pain.).   aspirin 325 MG EC tablet Take 1 tablet (325 mg total) by mouth 2 (two) times daily.   Azelastine HCl 0.15 % Soln Place 2 sprays into both nostrils at bedtime.   escitalopram 20 MG tablet Commonly known as:  LEXAPRO Take 20 mg by mouth daily.   losartan 100 MG tablet Commonly known as:  COZAAR Take 100 mg by mouth daily.   meclizine 25 MG tablet Commonly known as:  ANTIVERT Take 25 mg by mouth 3 (three) times daily as needed for dizziness.   Melatonin 10 MG Caps Take 10 mg by mouth at bedtime as needed (for sleep.).   montelukast 10 MG tablet Commonly known as:  SINGULAIR Take 1 tablet (10 mg total) by mouth at bedtime.   NEO-SYNEPHRINE 12 HOUR SPRAY 0.05 % nasal spray Generic drug:  oxymetazoline Place 1 spray into both  nostrils at bedtime.   Olopatadine HCl 0.2 % Soln Commonly known as:  PATADAY Place 1 drop into both eyes 1 day or 1 dose.   SYNTHROID 150 MCG tablet Generic drug:  levothyroxine       Past Medical History:  Diagnosis Date  . Anxiety   . Arthritis   . Depression   . Headache   . Hypertension   . Hypothyroidism     Past Surgical History:  Procedure Laterality Date  . ABDOMINAL HYSTERECTOMY    . ADENOIDECTOMY    . BREAST SURGERY     cyst removed  . PARTIAL KNEE ARTHROPLASTY Right 12/08/2015   Procedure: UNICOMPARTMENTAL KNEE;  Surgeon: Vickey Huger, MD;  Location: [facility];  Service: Orthopedics;  Laterality: Right;  . TONSILLECTOMY      Review of systems negative except as noted in HPI / PMHx or noted below:  Review of Systems  Constitutional: Negative.   HENT: Negative.   Eyes: Negative.   Respiratory: Negative.   Cardiovascular: Negative.   Gastrointestinal: Negative.   Genitourinary: Negative.   Musculoskeletal: Negative.   Skin: Negative.   Neurological: Negative.   Endo/Heme/Allergies: Negative.   Psychiatric/Behavioral: Negative.      Objective:   Vitals:   08/22/17 1545  BP: 112/68  Pulse: 95  Resp: 18  SpO2: (!) 74%  Physical Exam  HENT:  Head: Normocephalic.  Right Ear: Tympanic membrane, external ear and ear canal normal.  Left Ear: Tympanic membrane, external ear and ear canal normal.  Nose: Nose normal. No mucosal edema or rhinorrhea.  Mouth/Throat: Uvula is midline, oropharynx is clear and moist and mucous membranes are normal. No oropharyngeal exudate.  Eyes: Conjunctivae are normal.  Neck: Trachea normal. No tracheal tenderness present. No tracheal deviation present. No thyromegaly present.  Cardiovascular: Normal rate, regular rhythm, S1 normal, S2 normal and normal heart sounds.  No murmur heard. Pulmonary/Chest: Breath sounds normal. No stridor. No respiratory distress. She has no wheezes. She has no rales.    Musculoskeletal: She exhibits no edema.  Lymphadenopathy:       Head (right side): No tonsillar adenopathy present.       Head (left side): No tonsillar adenopathy present.    She has no cervical adenopathy.  Neurological: She is alert.  Skin: No rash noted. She is not diaphoretic. No erythema. Nails show no clubbing.    Diagnostics: none  Assessment and Plan:   1. Perennial allergic rhinitis   2. Perennial allergic conjunctivitis of both eyes   3. Rhinitis medicamentosa     1.  Continue to perform Allergen avoidance measures  2.  Can restart the following medications if symptoms return:    A. OTC Nasacort -1 spray each nostril two times per day  B. Azelastine -1 spray each nostril two times per day  C. Montelukast 10 mg -1 tablet  3.  If needed:   A.  Nasal saline  B.  Pataday 1 drop each eye twice a day  C.  Cetirizine 10 mg 1 tablet 1 time per day  4. Obtain fall flu vaccine  5. Return to clinic in 1 year or earlier if problem  Laasya appears to have had an excellent response to medical therapy and I think that she should continue to do well now that she has performed house dust avoidance measures and has stopped her over-the-counter nasal decongestant spray.  She can restart medications for respiratory tract inflammation as noted above should she redevelop significant symptoms as she moves forward.  I will see her back in this clinic in 1 year or earlier if there is a problem.  Allena Katz, MD Allergy / Immunology [facility]

## 2017-08-22 NOTE — Patient Instructions (Addendum)
  1.  Continue to perform Allergen avoidance measures  2.  Can restart the following medications if symptoms return:    A. OTC Nasacort -1 spray each nostril two times per day  B. Azelastine -1 spray each nostril two times per day  C. Montelukast 10 mg -1 tablet  3.  If needed:   A.  Nasal saline  B.  Pataday 1 drop each eye twice a day  C.  Cetirizine 10 mg 1 tablet 1 time per day  4. Obtain fall flu vaccine  5. Return to clinic in 1 year or earlier if problem

## 2017-08-23 ENCOUNTER — Encounter: Payer: Self-pay | Admitting: Allergy and Immunology

## 2018-03-30 DIAGNOSIS — M25551 Pain in right hip: Secondary | ICD-10-CM | POA: Insufficient documentation

## 2018-04-17 ENCOUNTER — Other Ambulatory Visit: Payer: Self-pay | Admitting: Radiology

## 2018-04-21 ENCOUNTER — Telehealth: Payer: Self-pay | Admitting: Hematology and Oncology

## 2018-04-21 NOTE — Telephone Encounter (Signed)
LVM for patient to return call for [facility] appointment for 3/11, [facility] patient appointment reminder sent

## 2018-04-24 ENCOUNTER — Encounter: Payer: Self-pay | Admitting: *Deleted

## 2018-04-24 ENCOUNTER — Telehealth: Payer: Self-pay | Admitting: Hematology and Oncology

## 2018-04-24 DIAGNOSIS — Z170 Estrogen receptor positive status [ER+]: Principal | ICD-10-CM

## 2018-04-24 DIAGNOSIS — C50412 Malignant neoplasm of upper-outer quadrant of left female breast: Secondary | ICD-10-CM

## 2018-04-24 NOTE — Telephone Encounter (Signed)
LVM again for patient to return my call in reference to morning [facility] appointment on 3/11

## 2018-04-25 ENCOUNTER — Encounter: Payer: Self-pay | Admitting: Hematology and Oncology

## 2018-04-25 NOTE — Progress Notes (Signed)
[facility] NOTE  Patient Care Team: Harlan Stains, MD as PCP - General (Family Medicine) Mauro Kaufmann, RN as Oncology Nurse Navigator Rockwell Germany, RN as Oncology Nurse Navigator Nicholas Lose, MD as Consulting Physician (Hematology and Oncology) Rolm Bookbinder, MD as Consulting Physician (General Surgery) Eppie Gibson, MD as Attending Physician (Radiation Oncology)  CHIEF COMPLAINTS/PURPOSE OF CONSULTATION: Newly diagnosed breast cancer  HISTORY OF PRESENTING ILLNESS:  Erika Huynh 73 y.o. female is here because of recent diagnosis of stage 1a lobular carcinoma of the left breast. The cancer was detected on a diagnostic mammogram and [place] on 04/11/18 that was performed as a 6 month follow-up for a left breast distortion that was not palpable prior to diagnosis. It showed a 0.6cm distortion in the left breast 10cm from the nipple and no left axilla abnormalities. A biopsy on 04/17/18 showed the cancer to be grade 2 lobular carcinoma, HER2 negative, ER/PR 90%, KI67 1%.  She presents to the clinic today with her husband. She reports a family history of breast cancer and pancreatic cancer on the maternal side. She is considering choosing between radiation and antiestrogen therapy and will weigh the benefits and side effects. She has had boned density scans done by Dr. Harlan Stains at [facility].  I reviewed her records extensively and collaborated the history with the patient.  SUMMARY OF ONCOLOGIC HISTORY:   Malignant neoplasm of upper-outer quadrant of left breast in female, estrogen receptor positive ([place])   04/17/2018 Initial Diagnosis    Screening detected left breast asymmetry 0.5 cm by mammogram, ultrasound revealed 0.6 cm mass at 3 o'clock position axilla negative, biopsy revealed grade 2 ILC, ER 90%, PR 90%, Ki-67 1%, HER-2 -1+, T1b N0 stage Ia clinical stage    04/26/2018 Cancer Staging    Staging form: Breast, AJCC 8th Edition - Clinical stage  from 04/26/2018: Stage IA (cT1b, cN0, cM0, G2, ER+, PR+, HER2-) - Signed by Nicholas Lose, MD on 04/26/2018    MEDICAL HISTORY:  Past Medical History:  Diagnosis Date  . Anxiety   . Arthritis   . Depression   . Headache   . Hypertension   . Hypothyroidism     SURGICAL HISTORY: Past Surgical History:  Procedure Laterality Date  . ABDOMINAL HYSTERECTOMY    . ADENOIDECTOMY    . BREAST SURGERY     cyst removed  . PARTIAL KNEE ARTHROPLASTY Right 12/08/2015   Procedure: UNICOMPARTMENTAL KNEE;  Surgeon: Vickey Huger, MD;  Location: [place];  Service: Orthopedics;  Laterality: Right;  . TONSILLECTOMY      SOCIAL HISTORY: Social History   Socioeconomic History  . Marital status: Married    Spouse name: Not on file  . Number of children: Not on file  . Years of education: Not on file  . Highest education level: Not on file  Occupational History  . Not on file  Social Needs  . Financial resource strain: Not on file  . Food insecurity:    Worry: Not on file    Inability: Not on file  . Transportation needs:    Medical: Not on file    Non-medical: Not on file  Tobacco Use  . Smoking status: Never Smoker  . Smokeless tobacco: Never Used  Substance and Sexual Activity  . Alcohol use: Yes    Alcohol/week: 0.0 standard drinks    Comment: occas.  . Drug use: No  . Sexual activity: Not on file  Lifestyle  . Physical activity:  Days per week: Not on file    Minutes per session: Not on file  . Stress: Not on file  Relationships  . Social connections:    Talks on phone: Not on file    Gets together: Not on file    Attends religious service: Not on file    Active member of club or organization: Not on file    Attends meetings of clubs or organizations: Not on file    Relationship status: Not on file  . Intimate partner violence:    Fear of current or ex partner: Not on file    Emotionally abused: Not on file    Physically abused: Not on file    Forced sexual activity: Not  on file  Other Topics Concern  . Not on file  Social History Narrative  . Not on file    FAMILY HISTORY: Family History  Problem Relation Age of Onset  . Lung cancer Father   . Breast cancer Maternal Aunt   . Throat cancer Maternal Uncle   . Pancreatic cancer Maternal Grandmother     ALLERGIES:  is allergic to darvon [propoxyphene].  MEDICATIONS:  Current Outpatient Medications  Medication Sig Dispense Refill  . acetaminophen (TYLENOL) 500 MG tablet Take 1,000 mg by mouth every 6 (six) hours as needed (for pain.).    Marland Kitchen escitalopram (LEXAPRO) 20 MG tablet Take 20 mg by mouth daily.     Marland Kitchen losartan (COZAAR) 100 MG tablet Take 100 mg by mouth daily.     . meclizine (ANTIVERT) 25 MG tablet Take 25 mg by mouth 3 (three) times daily as needed for dizziness.    . Melatonin 10 MG CAPS Take 10 mg by mouth at bedtime as needed (for sleep.).    Marland Kitchen SYNTHROID 150 MCG tablet     . aspirin EC 325 MG EC tablet Take 1 tablet (325 mg total) by mouth 2 (two) times daily. (Patient not taking: Reported on 04/26/2018) 30 tablet 0  . Azelastine HCl 0.15 % SOLN Place 2 sprays into both nostrils at bedtime. (Patient not taking: Reported on 04/26/2018) 30 mL 5  . montelukast (SINGULAIR) 10 MG tablet Take 1 tablet (10 mg total) by mouth at bedtime. (Patient not taking: Reported on 04/26/2018) 30 tablet 5  . Olopatadine HCl (PATADAY) 0.2 % SOLN Place 1 drop into both eyes 1 day or 1 dose. (Patient not taking: Reported on 04/26/2018) 1 Bottle 5  . oxymetazoline (NEO-SYNEPHRINE 12 HOUR SPRAY) 0.05 % nasal spray Place 1 spray into both nostrils at bedtime.     No current facility-administered medications for this visit.     REVIEW OF SYSTEMS:   Constitutional: Denies fevers, chills or abnormal night sweats Eyes: Denies blurriness of vision, double vision or watery eyes Ears, nose, mouth, throat, and face: Denies mucositis or sore throat Respiratory: Denies cough, dyspnea or wheezes Cardiovascular: Denies  palpitation, chest discomfort or lower extremity swelling Gastrointestinal:  Denies nausea, heartburn or change in bowel habits Skin: Denies abnormal skin rashes Lymphatics: Denies new lymphadenopathy or easy bruising Neurological:Denies numbness, tingling or new weaknesses Behavioral/Psych: Mood is stable, no new changes  Breast: Denies any palpable lumps or discharge All other systems were reviewed with the patient and are negative.  PHYSICAL EXAMINATION: ECOG PERFORMANCE STATUS: 1 - Symptomatic but completely ambulatory  Vitals:   04/26/18 0848  BP: 129/60  Pulse: 66  Resp: 18  Temp: 98.1 F (36.7 C)  SpO2: 95%   Filed Weights   04/26/18 0848  Weight: 203 lb 12.8 oz (92.4 kg)    GENERAL:alert, no distress and comfortable SKIN: skin color, texture, turgor are normal, no rashes or significant lesions EYES: normal, conjunctiva are pink and non-injected, sclera clear OROPHARYNX:no exudate, no erythema and lips, buccal mucosa, and tongue normal  NECK: supple, thyroid normal size, non-tender, without nodularity LYMPH:  no palpable lymphadenopathy in the cervical, axillary or inguinal LUNGS: clear to auscultation and percussion with normal breathing effort HEART: regular rate & rhythm and no murmurs and no lower extremity edema ABDOMEN:abdomen soft, non-tender and normal bowel sounds Musculoskeletal:no cyanosis of digits and no clubbing  PSYCH: alert & oriented x 3 with fluent speech NEURO: no focal motor/sensory deficits  LABORATORY DATA:  I have reviewed the data as listed Lab Results  Component Value Date   WBC 5.4 04/26/2018   HGB 13.6 04/26/2018   HCT 42.2 04/26/2018   MCV 97.0 04/26/2018   PLT 245 04/26/2018   Lab Results  Component Value Date   NA 141 04/26/2018   K 4.1 04/26/2018   CL 105 04/26/2018   CO2 27 04/26/2018    RADIOGRAPHIC STUDIES: I have personally reviewed the radiological reports and agreed with the findings in the report.  ASSESSMENT AND  PLAN:  Malignant neoplasm of upper-outer quadrant of left breast in female, estrogen receptor positive ([place]) 04/17/2018:Screening detected left breast asymmetry 0.5 cm by mammogram, ultrasound revealed 0.6 cm mass at 3 o'clock position axilla negative, biopsy revealed grade 2 ILC, ER 90%, PR 90%, Ki-67 1%, HER-2 -1+, T1b N0 stage Ia clinical stage  Pathology and radiology counseling: Discussed with the patient, the details of pathology including the type of breast cancer,the clinical staging, the significance of ER, PR and HER-2/neu receptors and the implications for treatment. After reviewing the pathology in detail, we proceeded to discuss the different treatment options between surgery, radiation, chemotherapy, antiestrogen therapies.  Recommendation: 1.  Breast conserving surgery 2.  Plus/minus radiation 3.  Antiestrogen therapy 4.  Genetics consultation (maternal aunt 62, paternal grandmother ovarian cancer age 6)  Patient is contemplating on not taking antiestrogen therapy if she does radiation.  Return to clinic after surgery to discuss final pathology    All questions were answered. The patient knows to call the clinic with any problems, questions or concerns.   Nicholas Lose, MD 04/26/2018   I, Cloyde Reams Dorshimer, am acting as scribe for Nicholas Lose, MD.  I have reviewed the above documentation for accuracy and completeness, and I agree with the above.

## 2018-04-26 ENCOUNTER — Other Ambulatory Visit: Payer: Self-pay

## 2018-04-26 ENCOUNTER — Ambulatory Visit (HOSPITAL_BASED_OUTPATIENT_CLINIC_OR_DEPARTMENT_OTHER): Payer: Medicare Other | Admitting: Licensed Clinical Social Worker

## 2018-04-26 ENCOUNTER — Encounter: Payer: Self-pay | Admitting: Physical Therapy

## 2018-04-26 ENCOUNTER — Ambulatory Visit: Payer: Medicare Other | Attending: General Surgery | Admitting: Physical Therapy

## 2018-04-26 ENCOUNTER — Other Ambulatory Visit: Payer: Self-pay | Admitting: General Surgery

## 2018-04-26 ENCOUNTER — Encounter: Payer: Self-pay | Admitting: Hematology and Oncology

## 2018-04-26 ENCOUNTER — Inpatient Hospital Stay: Payer: Medicare Other | Attending: Hematology and Oncology | Admitting: Hematology and Oncology

## 2018-04-26 ENCOUNTER — Ambulatory Visit
Admission: RE | Admit: 2018-04-26 | Discharge: 2018-04-26 | Disposition: A | Discharge status: Home / Self Care | Payer: Medicare Other | Source: Ambulatory Visit | Attending: Radiation Oncology | Admitting: Radiation Oncology

## 2018-04-26 ENCOUNTER — Inpatient Hospital Stay: Payer: Medicare Other

## 2018-04-26 ENCOUNTER — Encounter: Payer: Self-pay | Admitting: Licensed Clinical Social Worker

## 2018-04-26 DIAGNOSIS — Z8041 Family history of malignant neoplasm of ovary: Secondary | ICD-10-CM | POA: Diagnosis not present

## 2018-04-26 DIAGNOSIS — R293 Abnormal posture: Secondary | ICD-10-CM | POA: Diagnosis present

## 2018-04-26 DIAGNOSIS — Z801 Family history of malignant neoplasm of trachea, bronchus and lung: Secondary | ICD-10-CM | POA: Diagnosis not present

## 2018-04-26 DIAGNOSIS — C50412 Malignant neoplasm of upper-outer quadrant of left female breast: Secondary | ICD-10-CM | POA: Diagnosis present

## 2018-04-26 DIAGNOSIS — Z170 Estrogen receptor positive status [ER+]: Secondary | ICD-10-CM | POA: Diagnosis not present

## 2018-04-26 DIAGNOSIS — Z800 Family history of malignant neoplasm of digestive organs: Secondary | ICD-10-CM | POA: Insufficient documentation

## 2018-04-26 DIAGNOSIS — Z803 Family history of malignant neoplasm of breast: Secondary | ICD-10-CM | POA: Insufficient documentation

## 2018-04-26 LAB — CMP (CANCER CENTER ONLY)
ALT: 13 U/L (ref 0–44)
AST: 13 U/L — AB (ref 15–41)
Albumin: 3.6 g/dL (ref 3.5–5.0)
Alkaline Phosphatase: 97 U/L (ref 38–126)
Anion gap: 9 (ref 5–15)
BUN: 16 mg/dL (ref 8–23)
CHLORIDE: 105 mmol/L (ref 98–111)
CO2: 27 mmol/L (ref 22–32)
CREATININE: 0.83 mg/dL (ref 0.44–1.00)
Calcium: 8.6 mg/dL — ABNORMAL LOW (ref 8.9–10.3)
GFR, Est AFR Am: >60 mL/min (ref >60)
GFR, Estimated: >60 mL/min (ref >60)
Glucose, Bld: 82 mg/dL (ref 70–99)
POTASSIUM: 4.1 mmol/L (ref 3.5–5.1)
Sodium: 141 mmol/L (ref 135–145)
Total Bilirubin: 0.4 mg/dL (ref 0.3–1.2)
Total Protein: 6.7 g/dL (ref 6.5–8.1)

## 2018-04-26 LAB — CBC WITH DIFFERENTIAL (CANCER CENTER ONLY)
ABS IMMATURE GRANULOCYTES: 0.02 10*3/uL (ref 0.00–0.07)
BASOS ABS: 0 10*3/uL (ref 0.0–0.1)
Basophils Relative: 1 %
EOS PCT: 2 %
Eosinophils Absolute: 0.1 10*3/uL (ref 0.0–0.5)
HEMATOCRIT: 42.2 % (ref 36.0–46.0)
HEMOGLOBIN: 13.6 g/dL (ref 12.0–15.0)
Immature Granulocytes: 0 %
LYMPHS ABS: 1.5 10*3/uL (ref 0.7–4.0)
LYMPHS PCT: 28 %
MCH: 31.3 pg (ref 26.0–34.0)
MCHC: 32.2 g/dL (ref 30.0–36.0)
MCV: 97 fL (ref 80.0–100.0)
MONO ABS: 0.8 10*3/uL (ref 0.1–1.0)
Monocytes Relative: 14 %
NEUTROS ABS: 3 10*3/uL (ref 1.7–7.7)
Neutrophils Relative %: 55 %
Platelet Count: 245 10*3/uL (ref 150–400)
RBC: 4.35 MIL/uL (ref 3.87–5.11)
RDW: 12.7 % (ref 11.5–15.5)
WBC: 5.4 10*3/uL (ref 4.0–10.5)
nRBC: 0 % (ref 0.0–0.2)

## 2018-04-26 NOTE — Therapy (Signed)
[place], [place], [address] Phone: [phone]   Fax:  [phone]  Physical Therapy Evaluation  Patient Details  Name: Erika Huynh MRN: [id number] Date of Birth: 1946/01/30 Referring Provider (PT): Dr. Rolm Bookbinder   Encounter Date: 04/26/2018  PT End of Session - 04/26/18 1047    Visit Number  1    Number of Visits  2    Date for PT Re-Evaluation  06/21/18    PT Start Time  0949    PT Stop Time  1010    PT Time Calculation (min)  21 min    Activity Tolerance  Patient tolerated treatment well    Behavior During Therapy  [facility] for tasks assessed/performed       Past Medical History:  Diagnosis Date  . Anxiety   . Arthritis   . Depression   . Headache   . Hypertension   . Hypothyroidism     Past Surgical History:  Procedure Laterality Date  . ABDOMINAL HYSTERECTOMY    . ADENOIDECTOMY    . BREAST SURGERY     cyst removed  . PARTIAL KNEE ARTHROPLASTY Right 12/08/2015   Procedure: UNICOMPARTMENTAL KNEE;  Surgeon: Vickey Huger, MD;  Location: [facility];  Service: Orthopedics;  Laterality: Right;  . TONSILLECTOMY      There were no vitals filed for this visit.   Subjective Assessment - 04/26/18 1040    Subjective  Patient reports she is here today to be seen by her medical team for her newly diagnosed left breast cancer    Patient is accompained by:  Family member    Pertinent History  Patient was diagnosed on 04/11/2018 with left grade II invasive lobular carcinoma breast cancer. It measures 6 mm and is located in the upper outer quadrant. It is ER/PR positive and HER2 negative with a Ki67 of 1%. She has hypertension and a history of a partial right knee replacement.    Patient Stated Goals  reduce lymphedema risk and lear post op shoulder ROM HEP    Currently in Pain?  No/denies   Recently had a cortisone injection for right hip OA        [facility] PT Assessment - 04/26/18 0001      Assessment   Medical Diagnosis  Left breast cancer    Referring Provider (PT)  Dr. Rolm Bookbinder    Onset Date/Surgical Date  04/11/18    Hand Dominance  Right    Prior Therapy  None      Precautions   Precautions  Other (comment)    Precaution Comments  Active cancer      Restrictions   Weight Bearing Restrictions  No      Balance Screen   Has the patient fallen in the past 6 months  No    Has the patient had a decrease in activity level because of a fear of falling?   No    Is the patient reluctant to leave their home because of a fear of falling?   No      Home Environment   Living Environment  Private residence    Living Arrangements  Spouse/significant other    Available Help at Discharge  Family      Prior Function   Level of [place]  Retired    Leisure  She does not exercise      Cognition   Overall Cognitive Status  Within Functional Limits  for tasks assessed      Posture/Postural Control   Posture/Postural Control  Postural limitations    Postural Limitations  Rounded Shoulders;Forward head      ROM / Strength   AROM / PROM / Strength  AROM;Strength      AROM   AROM Assessment Site  Shoulder;Cervical    Right/Left Shoulder  Right;Left    Right Shoulder Extension  37 Degrees    Right Shoulder Flexion  136 Degrees    Right Shoulder ABduction  146 Degrees    Right Shoulder Internal Rotation  63 Degrees    Right Shoulder External Rotation  80 Degrees    Left Shoulder Extension  40 Degrees    Left Shoulder Flexion  140 Degrees    Left Shoulder ABduction  152 Degrees    Left Shoulder Internal Rotation  52 Degrees    Left Shoulder External Rotation  90 Degrees    Cervical Flexion  WNL    Cervical Extension  WNL    Cervical - Right Side Bend  25% limited    Cervical - Left Side Bend  25% limited    Cervical - Right Rotation  25% limited    Cervical - Left Rotation  25% limited      Strength   Overall Strength  Within  functional limits for tasks performed        LYMPHEDEMA/ONCOLOGY QUESTIONNAIRE - 04/26/18 1045      Type   Cancer Type  Left breast cancer      Lymphedema Assessments   Lymphedema Assessments  Upper extremities      Right Upper Extremity Lymphedema   10 cm Proximal to Olecranon Process  32.4 cm    Olecranon Process  27 cm    10 cm Proximal to Ulnar Styloid Process  23.5 cm    Just Proximal to Ulnar Styloid Process  16.9 cm    Across Hand at [employer]  20.2 cm    At [place] of 2nd Digit  6.8 cm      Left Upper Extremity Lymphedema   10 cm Proximal to Olecranon Process  32.7 cm    Olecranon Process  26.8 cm    10 cm Proximal to Ulnar Styloid Process  22.9 cm    Just Proximal to Ulnar Styloid Process  16.8 cm    Across Hand at [employer]  19.8 cm    At [place] of 2nd Digit  6.6 cm          Quick Dash - 04/26/18 0001    Open a tight or new jar  Moderate difficulty    Do heavy household chores (wash walls, wash floors)  No difficulty    Carry a shopping bag or briefcase  No difficulty    Wash your back  No difficulty    Use a knife to cut food  No difficulty    Recreational activities in which you take some force or impact through your arm, shoulder, or hand (golf, hammering, tennis)  No difficulty    During the past week, to what extent has your arm, shoulder or hand problem interfered with your normal social activities with family, friends, neighbors, or groups?  Not at all    During the past week, to what extent has your arm, shoulder or hand problem limited your work or other regular daily activities  Not at all    Arm, shoulder, or hand pain.  None    Tingling (pins and needles)  in your arm, shoulder, or hand  None    Difficulty Sleeping  No difficulty    DASH Score  4.55 %        Objective measurements completed on examination: See above findings.       Patient was instructed today in a home exercise program today for post op shoulder range of motion.  These included active assist shoulder flexion in sitting, scapular retraction, wall walking with shoulder abduction, and hands behind head external rotation.  She was encouraged to do these twice a day, holding 3 seconds and repeating 5 times when permitted by her physician.           PT Education - 04/26/18 1047    Education Details  Lymphedema risk reduction and post op shoulder ROM HEP    Person(s) Educated  Patient;Spouse    Methods  Explanation;Demonstration;Handout    Comprehension  Returned demonstration;Verbalized understanding          PT Long Term Goals - 04/26/18 1052      PT LONG TERM GOAL #1   Title  Patient will demonstrate she has regained full shoulder ROM and function post operatively compared to baseline assessment.    Time  8    Period  Weeks    Status  New      Breast Clinic Goals - 04/26/18 1052      Patient will be able to verbalize understanding of pertinent lymphedema risk reduction practices relevant to her diagnosis specifically related to skin care.   Time  1    Period  Days    Status  Achieved      Patient will be able to return demonstrate and/or verbalize understanding of the post-op home exercise program related to regaining shoulder range of motion.   Time  1    Period  Days    Status  Achieved      Patient will be able to verbalize understanding of the importance of attending the postoperative After Breast Cancer Class for further lymphedema risk reduction education and therapeutic exercise.   Time  1    Period  Days    Status  Achieved            Plan - 04/26/18 1048    Clinical Impression Statement  Patient was diagnosed on 04/11/2018 with left grade II invasive lobular carcinoma breast cancer. It measures 6 mm and is located in the upper outer quadrant. It is ER/PR positive and HER2 negative with a Ki67 of 1%. She has hypertension and a history of a partial right knee replacement. Her multidisciplinary medical team met prior  to her assessments to determine a recommended treatment plan. She is planning to have a left lumpectomy and sentinel node biopsy followed by radiation and anti-estrogen therapy. She will benefit from post op PT reassessment to determine needs.    Personal Factors and Comorbidities  Age    Stability/Clinical Decision Making  Stable/Uncomplicated    Clinical Decision Making  Low    Rehab Potential  Excellent    PT Frequency  --   Eval and 1 f/u visit   PT Treatment/Interventions  ADLs/Self Care Home Management;Patient/family education;Therapeutic exercise    PT Next Visit Plan  Will reassess 3-4 weeks post op to determine needs    PT Home Exercise Plan  Post op shoulder ROM HEP    Consulted and Agree with Plan of Care  Patient;Family member/caregiver    Family Member Consulted  Husband  Patient will benefit from skilled therapeutic intervention in order to improve the following deficits and impairments:  Decreased range of motion, Decreased knowledge of precautions, Postural dysfunction, Pain, Impaired UE functional use  Visit Diagnosis: Malignant neoplasm of upper-outer quadrant of left breast in female, estrogen receptor positive ([place]) - Plan: PT plan of care cert/re-cert  Abnormal posture - Plan: PT plan of care cert/re-cert   Patient will follow up at outpatient cancer rehab 3-4 weeks following surgery.  If the patient requires physical therapy at that time, a specific plan will be dictated and sent to the referring physician for approval. The patient was educated today on appropriate basic range of motion exercises to begin post operatively and the importance of attending the After Breast Cancer class following surgery.  Patient was educated today on lymphedema risk reduction practices as it pertains to recommendations that will benefit the patient immediately following surgery.  She verbalized good understanding.      Problem List Patient Active Problem List   Diagnosis Date  Noted  . Malignant neoplasm of upper-outer quadrant of left breast in female, estrogen receptor positive ([place]) 04/24/2018  . S/P right unicompartmental knee replacement 12/08/2015   Annia Friendly, PT 04/26/18 10:55 AM  [place], [place], [address] Phone: [phone]   Fax:  [phone]  Name: Erika Huynh MRN: [id number] Date of Birth: [birth year]

## 2018-04-26 NOTE — Patient Instructions (Signed)

## 2018-04-26 NOTE — Progress Notes (Signed)
REFERRING PROVIDER: Nicholas Lose, MD [place],  [id number]  PRIMARY PROVIDER:  Harlan Stains, MD  PRIMARY REASON FOR VISIT:  1. Malignant neoplasm of upper-outer quadrant of left breast in female, estrogen receptor positive ([place])   2. Family history of breast cancer   3. Family history of pancreatic cancer   4. Family history of throat cancer   5. Family history of lung cancer      HISTORY OF PRESENT ILLNESS:   Erika Huynh, a 73 y.o. female, was seen for a [facility] cancer genetics consultation during the breast multidisciplinary clinic due to her recent diagnosis of breast cancer.  Erika Huynh presents to clinic today to discuss the possibility of a hereditary predisposition to cancer, genetic testing, and to further clarify her future cancer risks, as well as potential cancer risks for family members.   In 2020, at the age of 55, Erika Huynh was diagnosed with invasive lobular carcinoma of the left breast, ER+, PR+, HER2-. The current treatment plan includes breast conserving surgery, plus/minus radiation, antiestrogen therapy.   CANCER HISTORY:    Malignant neoplasm of upper-outer quadrant of left breast in female, estrogen receptor positive ([place])   04/17/2018 Initial Diagnosis    Screening detected left breast asymmetry 0.5 cm by mammogram, ultrasound revealed 0.6 cm mass at 3 o'clock position axilla negative, biopsy revealed grade 2 ILC, ER 90%, PR 90%, Ki-67 1%, HER-2 -1+, T1b N0 stage Ia clinical stage    04/26/2018 Cancer Staging    Staging form: Breast, AJCC 8th Edition - Clinical stage from 04/26/2018: Stage IA (cT1b, cN0, cM0, G2, ER+, PR+, HER2-) - Signed by Nicholas Lose, MD on 04/26/2018     RISK FACTORS:  Menarche was at age 46.  First live birth at age 24.  OCP use for approximately 3 months. Ovaries intact: no.  Hysterectomy: yes.  Menopausal status: postmenopausal.  HRT use: 3 or 4 years. Colonoscopy: yes Mammogram within the  last year: yes.  Past Medical History:  Diagnosis Date  . Anxiety   . Arthritis   . Depression   . Family history of breast cancer   . Family history of lung cancer   . Family history of pancreatic cancer   . Family history of throat cancer   . Headache   . Hypertension   . Hypothyroidism     Past Surgical History:  Procedure Laterality Date  . ABDOMINAL HYSTERECTOMY    . ADENOIDECTOMY    . BREAST SURGERY     cyst removed  . PARTIAL KNEE ARTHROPLASTY Right 12/08/2015   Procedure: UNICOMPARTMENTAL KNEE;  Surgeon: Vickey Huger, MD;  Location: [facility];  Service: Orthopedics;  Laterality: Right;  . TONSILLECTOMY      Social History   Socioeconomic History  . Marital status: Married    Spouse name: Not on file  . Number of children: Not on file  . Years of education: Not on file  . Highest education level: Not on file  Occupational History  . Not on file  Social Needs  . Financial resource strain: Not on file  . Food insecurity:    Worry: Not on file    Inability: Not on file  . Transportation needs:    Medical: Not on file    Non-medical: Not on file  Tobacco Use  . Smoking status: Never Smoker  . Smokeless tobacco: Never Used  Substance and Sexual Activity  . Alcohol use: Yes    Alcohol/week: 0.0 standard drinks  Comment: occas.  . Drug use: No  . Sexual activity: Not on file  Lifestyle  . Physical activity:    Days per week: Not on file    Minutes per session: Not on file  . Stress: Not on file  Relationships  . Social connections:    Talks on phone: Not on file    Gets together: Not on file    Attends religious service: Not on file    Active member of club or organization: Not on file    Attends meetings of clubs or organizations: Not on file    Relationship status: Not on file  Other Topics Concern  . Not on file  Social History Narrative  . Not on file     FAMILY HISTORY:  We obtained a detailed, 4-generation family history.  Significant  diagnoses are listed below: Family History  Problem Relation Age of Onset  . Lung cancer Father   . Breast cancer Maternal Aunt   . Throat cancer Maternal Uncle   . Pancreatic cancer Maternal Grandmother    Erika Huynh has 2 daughters, ages 21 and 62. She has one brother who is 46 and has had skin cancer, unsure what type. This brother has 2 sons.   Ms. Dust mother had a history of "face cancer," she is unsure of details. Erika Huynh had 3 maternal aunts, 2 maternal uncles. One of her aunts had breast cancer in her 20s and is living at 49. An uncle had throat cancer diagnosed in his 70s, died in his 48s. No cancers for her maternal cousins. Her maternal grandmother had pancreatic cancer and died in her 73s. Maternal grandfather died of heart issues.  [name] father had lung cancer in his 59s and died in his 76s. [name] had one paternal uncle, one paternal aunt. Her paternal aunt had liver cancer. No cancers for her paternal cousins. Her paternal grandfather died in his 25s. Grandmother died in her 71s as well, she does remember her having a hysterectomy and suspects she may have had cancer.   [name] is unaware of previous family history of genetic testing for hereditary cancer risks. Patient's maternal ancestors are of Caucasian descent, and paternal ancestors are of Caucasian descent. There is no reported Ashkenazi Jewish ancestry. There is no known consanguinity.  GENETIC COUNSELING ASSESSMENT: Erika Huynh is a 73 y.o. female with a personal and family history of cancer which is somewhat suggestive of Hereditary Breast and Ovarian Cance and predisposition to cancer. We, therefore, discussed and recommended the following at today's visit.   DISCUSSION: We discussed that 5-10% of breast cancer cases are hereditary, with most cases associated with the BRCA1/2 genes.  There are other genes that are associated with other cancers as well, each gene is associated with its own  cancers. We reviewed the characteristics, features and inheritance patterns of hereditary cancer syndromes. We also discussed genetic testing, including the appropriate family members to test, the process of testing, insurance coverage and turn-around-time for results. We discussed the implications of a negative, positive and/or variant of uncertain significant result. In order to get genetic test results in a timely manner so that Erika Huynh can use these genetic test results for surgical decisions, we recommended Erika Huynh pursue genetic testing for the [facility] Breast Cancer STAT Panel. Once complete, we recommend Erika Huynh pursue reflex genetic testing to the Common Hereditary Cancers Panel gene panel.   The STAT Breast cancer panel offered by [facility] includes sequencing and rearrangement  analysis for the following 9 genes:  ATM, BRCA1, BRCA2, CDH1, CHEK2, PALB2, PTEN, STK11 and TP53.    The Common Hereditary Cancers Panel offered by [facility] includes sequencing and/or deletion duplication testing of the following 47 genes: APC, ATM, AXIN2, BARD1, BMPR1A, BRCA1, BRCA2, BRIP1, CDH1, CDKN2A (p14ARF), CDKN2A (p16INK4a), CKD4, CHEK2, CTNNA1, DICER1, EPCAM (Deletion/duplication testing only), GREM1 (promoter region deletion/duplication testing only), KIT, MEN1, MLH1, MSH2, MSH3, MSH6, MUTYH, NBN, NF1, NHTL1, PALB2, PDGFRA, PMS2, POLD1, POLE, PTEN, RAD50, RAD51C, RAD51D, SDHB, SDHC, SDHD, SMAD4, SMARCA4. STK11, TP53, TSC1, TSC2, and VHL.  The following genes were evaluated for sequence changes only: SDHA and HOXB13 c.251G>A variant only.  Based on Ms. Reuss's personal and family history of cancer, she meets medical criteria for genetic testing. Despite that she meets criteria, she may still have an out of pocket cost.   PLAN: After considering the risks, benefits, and limitations, Erika Huynh provided informed consent to pursue genetic testing and the saliva sample was sent to [facility] for  analysis of the Breast Cancer STAT panel. Results should be available within approximately 1 weeks' time, at which point they will be disclosed by telephone to Ms. Hill, as will any additional recommendations warranted by these results. Erika Huynh will receive a summary of her genetic counseling visit and a copy of her results once available. This information will also be available in Epic.   Based on Erika Huynh's family history, we recommended her maternal relatives have genetic counseling and testing given the family history of pancreatic cancer. Erika Huynh will let us know if we can be of any assistance in coordinating genetic counseling and/or testing for this family member.   Lastly, we encouraged Erika Huynh to remain in contact with cancer genetics annually so that we can continuously update the family history and inform her of any changes in cancer genetics and testing that may be of benefit for this family.   Erika Huynh questions were answered to her satisfaction today. Our contact information was provided should additional questions or concerns arise. Thank you for the referral and allowing [place] to share in the care of your patient.   Erika Rogue, MS Genetic Counselor [email or web address] Phone: [phone]  The patient was seen for a total of 15 minutes in face-to-face genetic counseling. She was accompanied by her husband, Dock. Drs. Magrinat, Lindi Adie and/or Burr Medico were available for discussion regarding this case.

## 2018-04-26 NOTE — Progress Notes (Unsigned)
Nutrition Assessment  Reason for Assessment:  Pt seen in Breast Clinic  ASSESSMENT:   73 year old female with new diagnosis of breast cancer.  Past medical history reviewed   Medications:  reviewed  Labs: reviewed  Anthropometrics:   Height: 65.5 inches Weight: 203 lb 12.8 oz BMI: 33   NUTRITION DIAGNOSIS: Food and nutrition related knowledge deficit related to new diagnosis of breast cancer as evidenced by no prior need for nutrition related information.  INTERVENTION:   Discussed and provided packet of information regarding nutritional tips for breast cancer patients.  Questions answered.  Teachback method used.  Contact information provided and patient knows to contact me with questions/concerns.    MONITORING, EVALUATION, and GOAL: Pt will consume a healthy plant based diet to maintain lean body mass throughout treatment.   Whitni Pasquini B. Zenia Resides, [place], [place] Registered Dietitian [phone] (pager)

## 2018-04-26 NOTE — Assessment & Plan Note (Signed)
04/17/2018:Screening detected left breast asymmetry 0.5 cm by mammogram, ultrasound revealed 0.6 cm mass at 3 o'clock position axilla negative, biopsy revealed grade 2 ILC, ER 90%, PR 90%, Ki-67 1%, HER-2 -1+, T1b N0 stage Ia clinical stage  Pathology and radiology counseling: Discussed with the patient, the details of pathology including the type of breast cancer,the clinical staging, the significance of ER, PR and HER-2/neu receptors and the implications for treatment. After reviewing the pathology in detail, we proceeded to discuss the different treatment options between surgery, radiation, chemotherapy, antiestrogen therapies.  Recommendation: 1.  Breast conserving surgery 2.  Plus/minus radiation 3.  Antiestrogen therapy 4.  Genetics consultation (maternal aunt 74, paternal grandmother ovarian cancer age 61)  Return to clinic after surgery to discuss final pathology

## 2018-04-28 ENCOUNTER — Encounter: Payer: Self-pay | Admitting: Radiation Oncology

## 2018-04-28 NOTE — Progress Notes (Signed)
Radiation Oncology         [phone] ________________________________  Initial outpatient Consultation  Name: Erika Huynh MRN: [id number]  Date: 04/26/2018  DOB: Nov 21, 1945  CC:Harlan Stains, MD  Rolm Bookbinder, MD   REFERRING PHYSICIAN: Rolm Bookbinder, MD  DIAGNOSIS:    ICD-10-CM   1. Malignant neoplasm of upper-outer quadrant of left breast in female, estrogen receptor positive [facility]) C50.412    Z17.0   Cancer Staging Malignant neoplasm of upper-outer quadrant of left breast in female, estrogen receptor positive ([place]) Staging form: Breast, AJCC 8th Edition - Clinical stage from 04/26/2018: Stage IA (cT1b, cN0, cM0, G2, ER+, PR+, HER2-) - Signed by Nicholas Lose, MD on 04/26/2018   CHIEF COMPLAINT: Here to discuss management of left breast cancer  HISTORY OF PRESENT ILLNESS::Erika Huynh is a 73 y.o. female who presented with breast abnormality on the following imaging: Left breast asymmetry on mammography.  Ultrasound revealed a 6 mm lesion at the 3 o'clock position.  Biopsy of this lesion showed grade 2 invasive lobular carcinoma, ER PR positive HER-2 negative.  Symptoms, if any, at that time, were: None.     The patient, on review of systems, reports breast lump after biopsy, joint pain, arthritis, depression, and thyroid problem  She had a partial knee replacement on the right side in the past.  PREVIOUS RADIATION THERAPY: No  PAST MEDICAL HISTORY:  has a past medical history of Anxiety, Arthritis, Depression, Family history of breast cancer, Family history of lung cancer, Family history of pancreatic cancer, Family history of throat cancer, Headache, Hypertension, and Hypothyroidism.    PAST SURGICAL HISTORY: Past Surgical History:  Procedure Laterality Date   ABDOMINAL HYSTERECTOMY     ADENOIDECTOMY     BREAST SURGERY     cyst removed   PARTIAL KNEE ARTHROPLASTY Right 12/08/2015   Procedure: UNICOMPARTMENTAL KNEE;  Surgeon: Vickey Huger, MD;   Location: [facility];  Service: Orthopedics;  Laterality: Right;   TONSILLECTOMY      FAMILY HISTORY: family history includes Breast cancer in her maternal aunt; Lung cancer in her father; Pancreatic cancer in her maternal grandmother; Throat cancer in her maternal uncle.  SOCIAL HISTORY:  reports that she has never smoked. She has never used smokeless tobacco. She reports current alcohol use. She reports that she does not use drugs.  ALLERGIES: Darvon [propoxyphene]  MEDICATIONS:  Current Outpatient Medications  Medication Sig Dispense Refill   acetaminophen (TYLENOL) 500 MG tablet Take 1,000 mg by mouth every 6 (six) hours as needed (for pain.).     aspirin EC 325 MG EC tablet Take 1 tablet (325 mg total) by mouth 2 (two) times daily. (Patient not taking: Reported on 04/26/2018) 30 tablet 0   Azelastine HCl 0.15 % SOLN Place 2 sprays into both nostrils at bedtime. (Patient not taking: Reported on 04/26/2018) 30 mL 5   escitalopram (LEXAPRO) 20 MG tablet Take 20 mg by mouth daily.      losartan (COZAAR) 100 MG tablet Take 100 mg by mouth daily.      meclizine (ANTIVERT) 25 MG tablet Take 25 mg by mouth 3 (three) times daily as needed for dizziness.     Melatonin 10 MG CAPS Take 10 mg by mouth at bedtime as needed (for sleep.).     montelukast (SINGULAIR) 10 MG tablet Take 1 tablet (10 mg total) by mouth at bedtime. (Patient not taking: Reported on 04/26/2018) 30 tablet 5   Olopatadine HCl (PATADAY) 0.2 % SOLN Place 1 drop  into both eyes 1 day or 1 dose. (Patient not taking: Reported on 04/26/2018) 1 Bottle 5   oxymetazoline (NEO-SYNEPHRINE 12 HOUR SPRAY) 0.05 % nasal spray Place 1 spray into both nostrils at bedtime.     SYNTHROID 150 MCG tablet      No current facility-administered medications for this encounter.     REVIEW OF SYSTEMS: A 10+ POINT REVIEW OF SYSTEMS WAS OBTAINED including neurology, dermatology, psychiatry, cardiac, respiratory, lymph, extremities, GI, GU,  Musculoskeletal, constitutional, breasts, reproductive, HEENT.  All pertinent positives are noted in the HPI.  All others are negative.   PHYSICAL EXAM:  Vitals - 1 value per visit 2/67/1245  SYSTOLIC 809  DIASTOLIC 60  Pulse 66  Temperature 98.1  Respirations 18  Weight (lb) 203.8  Height 5' 5.5"  BMI 33.4  VISIT REPORT    General: Alert and oriented, in no acute distress HEENT: Head is normocephalic. Extraocular movements are intact. Oropharynx is clear. Neck: Neck is supple, no palpable cervical or supraclavicular lymphadenopathy. Heart: Regular in rate and rhythm with no murmurs, rubs, or gallops. Chest: Clear to auscultation bilaterally, with no rhonchi, wheezes, or rales. Abdomen: Soft, nontender, nondistended, with no rigidity or guarding. Extremities: No cyanosis or edema. Lymphatics: see Neck Exam Skin: No concerning lesions. Musculoskeletal: symmetric strength and muscle tone throughout. Neurologic: Cranial nerves II through XII are grossly intact. No obvious focalities. Speech is fluent. Coordination is intact. Psychiatric: Judgment and insight are intact. Affect is appropriate. Breasts: no palpable masses appreciated in the breasts or axillae .   ECOG = 0  0 - Asymptomatic (Fully active, able to carry on all predisease activities without restriction)  1 - Symptomatic but completely ambulatory (Restricted in physically strenuous activity but ambulatory and able to carry out work of a light or sedentary nature. For example, light housework, office work)  2 - Symptomatic, <50% in bed during the day (Ambulatory and capable of all self care but unable to carry out any work activities. Up and about more than 50% of waking hours)  3 - Symptomatic, >50% in bed, but not bedbound (Capable of only limited self-care, confined to bed or chair 50% or more of waking hours)  4 - Bedbound (Completely disabled. Cannot carry on any self-care. Totally confined to bed or chair)  5 -  Death   Eustace Pen MM, Creech RH, Tormey DC, et al. [phone]). "Toxicity and response criteria of the [facility] Group". [place] Oncol. 5 (6): 649-55   LABORATORY DATA:  Lab Results  Component Value Date   WBC 5.4 04/26/2018   HGB 13.6 04/26/2018   HCT 42.2 04/26/2018   MCV 97.0 04/26/2018   PLT 245 04/26/2018   CMP     Component Value Date/Time   NA 141 04/26/2018 0807   K 4.1 04/26/2018 0807   CL 105 04/26/2018 0807   CO2 27 04/26/2018 0807   GLUCOSE 82 04/26/2018 0807   BUN 16 04/26/2018 0807   CREATININE 0.83 04/26/2018 0807   CALCIUM 8.6 (L) 04/26/2018 0807   PROT 6.7 04/26/2018 0807   ALBUMIN 3.6 04/26/2018 0807   AST 13 (L) 04/26/2018 0807   ALT 13 04/26/2018 0807   ALKPHOS 97 04/26/2018 0807   BILITOT 0.4 04/26/2018 0807   GFRNONAA >60 04/26/2018 0807   GFRAA >60 04/26/2018 0807         RADIOGRAPHY: As above    IMPRESSION/PLAN: left breast cancer, stage I, ER+  She has been discussed at our multidisciplinary tumor  board.  The consensus is that she would be a good candidate for breast conservation. I talked to her about the option of a mastectomy and informed her that her expected overall survival would be equivalent between mastectomy and breast conservation, based upon randomized controlled data. She is enthusiastic about breast conservation.  For the patient's early stage favorable risk breast cancer, we had a thorough discussion about her options for adjuvant therapy. One option would be antiestrogen therapy as discussed with medical oncology. She would take a pill for approximately 5 years. The alternative option (but less standard likely still very effective) would be radiotherapy to the breast. The most aggressive option would be to pursue both modalities.  Of note, I discussed the data from the [employer] al trial in the [place] of [employer]. She understands that tamoxifen compared to radiation plus tamoxifen demonstrated no survival  benefit among the women in this study. The women were 8 years or older with stage I estrogen receptor positive breast cancer. Based on this study, I told the patient that her overall life expectancy should not be affected by adding radiotherapy to antiestrogen medication. She understands that the main benefit of  adding radiotherapy to anti estrogen therapy would be a very small but measurable local control benefit (risk of local recurrence to be lowered from ~9% --> ~2% over a decade).  We discussed the fact that radiotherapy only provides a local control benefit while anti-estrogen pills provide systemic coverage. That being said, the risk of systemic failure is relatively low with her type of breast cancer.  She would like to think about the side effects of antiestrogen therapy and radiation therapy before making her decision.   We discussed the risks benefits and side effects of radiotherapy. She understands that the side effects would likely include some skin irritation and fatigue during the weeks of radiation. There is a risk of late effects which include but are not necessarily limited to cosmetic changes and rare lung toxicity. I would anticipate delivering approximately 3-4 weeks of radiotherapy.  After a thorough discussion, the patient would like to think about her options.  If she makes a decision by the end of today she will let our breast navigator know accordingly.  Otherwise, she can make her decision after surgery.  I wished her the very best and I will see her back PRN, depending on her preference. __________________________________________   Eppie Gibson, MD

## 2018-05-02 ENCOUNTER — Telehealth: Payer: Self-pay | Admitting: Hematology and Oncology

## 2018-05-02 ENCOUNTER — Telehealth: Payer: Self-pay | Admitting: *Deleted

## 2018-05-02 NOTE — Telephone Encounter (Signed)
Scheduled appt per 3/17 sch message - pt aware.   

## 2018-05-02 NOTE — Telephone Encounter (Signed)
Called pt regarding [facility] from 3.11.20. Unable to leave msg d/t mailbox full.

## 2018-05-03 ENCOUNTER — Telehealth: Payer: Self-pay | Admitting: Licensed Clinical Social Worker

## 2018-05-04 ENCOUNTER — Other Ambulatory Visit: Payer: Self-pay

## 2018-05-04 ENCOUNTER — Ambulatory Visit: Payer: Self-pay | Admitting: Licensed Clinical Social Worker

## 2018-05-04 ENCOUNTER — Encounter: Payer: Self-pay | Admitting: Licensed Clinical Social Worker

## 2018-05-04 ENCOUNTER — Encounter (HOSPITAL_BASED_OUTPATIENT_CLINIC_OR_DEPARTMENT_OTHER): Payer: Self-pay | Admitting: *Deleted

## 2018-05-04 DIAGNOSIS — Z1379 Encounter for other screening for genetic and chromosomal anomalies: Secondary | ICD-10-CM

## 2018-05-04 NOTE — Progress Notes (Addendum)
HPI:  Ms. Brodzinski was previously seen in the [place] clinic due to her recent diagnosis of breast cancer, family history of cancer and concerns regarding a hereditary predisposition to cancer. Please refer to our prior cancer genetics clinic note for more information regarding our discussion, assessment and recommendations, at the time. Ms. Herman recent genetic test results were disclosed to her, as were recommendations warranted by these results. These results and recommendations are discussed in more detail below.   In 2020, at the age of 73, Ms. Dickison was diagnosed with invasive lobular carcinoma of the left breast, ER+, PR+, HER2-. The current treatment plan includes breast conserving surgery, plus/minus radiation, antiestrogen therapy.   CANCER HISTORY:    Malignant neoplasm of upper-outer quadrant of left breast in female, estrogen receptor positive ([place])   04/17/2018 Initial Diagnosis    Screening detected left breast asymmetry 0.5 cm by mammogram, ultrasound revealed 0.6 cm mass at 3 o'clock position axilla negative, biopsy revealed grade 2 ILC, ER 90%, PR 90%, Ki-67 1%, HER-2 -1+, T1b N0 stage Ia clinical stage    04/26/2018 Cancer Staging    Staging form: Breast, AJCC 8th Edition - Clinical stage from 04/26/2018: Stage IA (cT1b, cN0, cM0, G2, ER+, PR+, HER2-) - Signed by Nicholas Lose, MD on 04/26/2018     FAMILY HISTORY:  We obtained a detailed, 4-generation family history.  Significant diagnoses are listed below: Family History  Problem Relation Age of Onset  . Lung cancer Father   . Breast cancer Maternal Aunt   . Throat cancer Maternal Uncle   . Pancreatic cancer Maternal Grandmother     Ms. Acord has 2 daughters, ages 30 and 41. She has one brother who is 89 and has had skin cancer, unsure what type. This brother has 2 sons.   Ms. Dauphin mother had a history of "face cancer," she is unsure of details. Ms. Tep had 3 maternal aunts, 2 maternal  uncles. One of her aunts had breast cancer in her 18s and is living at 20. An uncle had throat cancer diagnosed in his 80s, died in his 61s. No cancers for her maternal cousins. Her maternal grandmother had pancreatic cancer and died in her 6s. Maternal grandfather died of heart issues.  Ms. Murillo father had lung cancer in his 2s and died in his 10s. Ms Hildebrant had one paternal uncle, one paternal aunt. Her paternal aunt had liver cancer. No cancers for her paternal cousins. Her paternal grandfather died in his 57s. Grandmother died in her 78s as well, she does remember her having a hysterectomy and suspects she may have had cancer.   Ms. Frink is unaware of previous family history of genetic testing for hereditary cancer risks. Patient's maternal ancestors are of Caucasian descent, and paternal ancestors are of Caucasian descent. There is no reported Ashkenazi Jewish ancestry. There is no known consanguinity.  GENETIC TEST RESULTS: Genetic testing reported out on 05/02/2018 through the [facility] Breast Cancer STAT Panel + Common Hereditary cancer panel found no pathogenic mutations.   The STAT Breast cancer panel offered by [facility] includes sequencing and rearrangement analysis for the following 9 genes:  ATM, BRCA1, BRCA2, CDH1, CHEK2, PALB2, PTEN, STK11 and TP53.    The Common Hereditary Cancers Panel offered by [facility] includes sequencing and/or deletion duplication testing of the following 47 genes: APC, ATM, AXIN2, BARD1, BMPR1A, BRCA1, BRCA2, BRIP1, CDH1, CDKN2A (p14ARF), CDKN2A (p16INK4a), CKD4, CHEK2, CTNNA1, DICER1, EPCAM (Deletion/duplication testing only), GREM1 (promoter region deletion/duplication testing only),  KIT, MEN1, MLH1, MSH2, MSH3, MSH6, MUTYH, NBN, NF1, NHTL1, PALB2, PDGFRA, PMS2, POLD1, POLE, PTEN, RAD50, RAD51C, RAD51D, SDHB, SDHC, SDHD, SMAD4, SMARCA4. STK11, TP53, TSC1, TSC2, and VHL.  The following genes were evaluated for sequence changes only: SDHA and HOXB13 c.251G>A  variant only.  The test report has been scanned into EPIC and is located under the Molecular Pathology section of the Results Review tab.     We discussed with Ms. Lucy that because current genetic testing is not perfect, it is possible there may be a gene mutation in one of these genes that current testing cannot detect, but that chance is small.  We also discussed, that there could be another gene that has not yet been discovered, or that we have not yet tested, that is responsible for the cancer diagnoses in the family. It is also possible there is a hereditary cause for the cancer in the family that Ms. Lech did not inherit and therefore was not identified in her testing.  Therefore, it is important to remain in touch with cancer genetics in the future so that we can continue to offer Ms. Peltz the most up to date genetic testing.   ADDITIONAL GENETIC TESTING: We discussed with Ms. Roscher that her genetic testing was fairly extensive.  If there are genes identified to increase cancer risk that can be analyzed in the future, we would be happy to discuss and coordinate this testing at that time.    CANCER SCREENING RECOMMENDATIONS: Ms. Spinola test result is considered negative (normal).  This means that we have not identified a hereditary cause for her personal and family history of cancer at this time. Most cancers happen by chance and this negative test suggests that her cancer may fall into this category.    While reassuring, this does not definitively rule out a hereditary predisposition to cancer. It is still possible that there could be genetic mutations that are undetectable by current technology. There could be genetic mutations in genes that have not been tested or identified to increase cancer risk.  Therefore, it is recommended she continue to follow the cancer management and screening guidelines provided by her oncology and primary healthcare provider.   An individual's cancer  risk and medical management are not determined by genetic test results alone. Overall cancer risk assessment incorporates additional factors, including personal medical history, family history, and any available genetic information that may result in a personalized plan for cancer prevention and surveillance  RECOMMENDATIONS FOR FAMILY MEMBERS:  Women in this family might be at some increased risk of developing cancer, over the general population risk, simply due to the family history of cancer.  We recommended women in this family have a yearly mammogram beginning at age 49, or 46 years younger than the earliest onset of cancer, an annual clinical breast exam, and perform monthly breast self-exams. Women in this family should also have a gynecological exam as recommended by their primary provider. All family members should have a colonoscopy by age 39.  It is also possible there is a hereditary cause for the cancer in Ms. Gilbo's family that she did not inherit and therefore was not identified in her.  We recommended her maternal relatives have genetic counseling and testing given the family history of pancreatic cancer. Ms. Lomeli will let us know if we can be of any assistance in coordinating genetic counseling and/or testing for this family member.   FOLLOW-UP: Lastly, we discussed with Ms. Messamore that cancer genetics  is a rapidly advancing field and it is possible that new genetic tests will be appropriate for her and/or her family members in the future. We encouraged her to remain in contact with cancer genetics on an annual basis so we can update her personal and family histories and let her know of advances in cancer genetics that may benefit this family.   Our contact number was provided. Ms. Frei questions were answered to her satisfaction, and she knows she is welcome to call us at anytime with additional questions or concerns.   Faith Rogue, MS Genetic  [email or web address] Phone: [phone]

## 2018-05-04 NOTE — Telephone Encounter (Signed)
Revealed negative genetic testing.  This normal result is reassuring and indicates that it is unlikely Erika Huynh's cancer is due to a hereditary cause.  It is unlikely that there is an increased risk of another cancer due to a mutation in one of these genes.  However, genetic testing is not perfect, and cannot definitively rule out a hereditary cause.  It will be important for her to keep in contact with genetics to learn if any additional testing may be needed in the future.     

## 2018-05-08 ENCOUNTER — Encounter: Payer: Self-pay | Admitting: *Deleted

## 2018-05-08 ENCOUNTER — Other Ambulatory Visit: Payer: Self-pay | Admitting: Hematology and Oncology

## 2018-05-08 MED ORDER — ANASTROZOLE 1 MG PO TABS: 1.0000 mg | ORAL_TABLET | Freq: Every day | ORAL | 3 refills | Status: DC

## 2018-05-08 NOTE — Progress Notes (Signed)
Because surgery is not happening, I would like to start her on anastrozole 1 mg daily. I sent a prescription to [facility]. I called her about it and there was no ability to leave a voicemail. If she calls [place] please inform her of the prescription.  She can start taking it once a day until surgery.

## 2018-05-08 NOTE — Progress Notes (Signed)
[place] Psychosocial Distress Screening Clinical Social Work  Clinical Social Work was referred by distress screening protocol.  The patient scored a 1 on the Psychosocial Distress Thermometer which indicates mild distress. Clinical Social Worker contacted patient at home to assess for distress and other psychosocial needs, and follow up  After [facility].  Patients voicemail box was full, CSW unable to leave a message.  CSW will continue attempting to contact patient.     ONCBCN DISTRESS SCREENING 05/08/2018  Screening Type Initial Screening  Distress experienced in past week (1-10) 1  Emotional problem type Depression    Johnnye Lana, MSW, LCSW, OSW-C Clinical Social Worker [place] [phone]

## 2018-05-11 ENCOUNTER — Ambulatory Visit (HOSPITAL_BASED_OUTPATIENT_CLINIC_OR_DEPARTMENT_OTHER): Admission: RE | Admit: 2018-05-11 | Payer: Medicare Other | Source: Home / Self Care | Admitting: General Surgery

## 2018-05-11 ENCOUNTER — Ambulatory Visit (HOSPITAL_COMMUNITY): Payer: Medicare Other

## 2018-05-11 SURGERY — BREAST LUMPECTOMY WITH RADIOACTIVE SEED AND SENTINEL LYMPH NODE BIOPSY
Anesthesia: General | Site: Breast | Laterality: Left

## 2018-05-12 ENCOUNTER — Telehealth: Payer: Self-pay | Admitting: *Deleted

## 2018-05-12 NOTE — Telephone Encounter (Signed)
Attempted to leave message regarding taking anastrozole due to surgery delay but her voicemail was full.

## 2018-05-18 ENCOUNTER — Inpatient Hospital Stay: Payer: Medicare Other | Attending: Hematology and Oncology | Admitting: Hematology and Oncology

## 2018-05-18 NOTE — Assessment & Plan Note (Deleted)
04/17/2018:Screening detected left breast asymmetry 0.5 cm by mammogram, ultrasound revealed 0.6 cm mass at 3 o'clock position axilla negative, biopsy revealed grade 2 ILC, ER 90%, PR 90%, Ki-67 1%, HER-2 -1+, T1b N0 stage Ia clinical stage  Treatment plan: 1.  Breast conserving surgery 2.  Plus/minus radiation 3.  Antiestrogen therapy ---------------------------------------------------------------------------------------------------------------------------- Because surgery is not possible in the current coronavirus pandemic, we started her on antiestrogen therapy with anastrozole 1 mg daily.  Anastrozole toxicities:  Return to clinic in 3 months with another mammogram and ultrasound.

## 2018-05-29 ENCOUNTER — Telehealth: Payer: Self-pay | Admitting: *Deleted

## 2018-05-29 NOTE — Telephone Encounter (Signed)
Received call back from patient. She states she has been taking the anastrozole for about a week and doing fine.

## 2018-05-29 NOTE — Telephone Encounter (Signed)
I was able to leave a voicemail on patient's home phone regarding anastrozole prescription.  Gave instructions on voicemail to call back to make sure she received the message.

## 2018-05-31 DIAGNOSIS — M1611 Unilateral primary osteoarthritis, right hip: Secondary | ICD-10-CM | POA: Insufficient documentation

## 2018-06-05 ENCOUNTER — Encounter: Payer: Self-pay | Admitting: *Deleted

## 2018-06-05 ENCOUNTER — Telehealth: Payer: Self-pay | Admitting: Radiation Oncology

## 2018-06-05 DIAGNOSIS — Z170 Estrogen receptor positive status [ER+]: Principal | ICD-10-CM

## 2018-06-05 DIAGNOSIS — C50412 Malignant neoplasm of upper-outer quadrant of left female breast: Secondary | ICD-10-CM

## 2018-06-05 NOTE — Telephone Encounter (Signed)
New Message: ° ° °LVM for patient to return call to schedule appt from referral received. °

## 2018-06-06 ENCOUNTER — Telehealth: Payer: Self-pay | Admitting: *Deleted

## 2018-06-06 NOTE — Telephone Encounter (Signed)
Per 4/20 los scheduled the patient to see Dr. Lindi Adie on 5/19

## 2018-06-12 ENCOUNTER — Encounter: Payer: Self-pay | Admitting: *Deleted

## 2018-06-20 ENCOUNTER — Other Ambulatory Visit: Payer: Self-pay

## 2018-06-20 ENCOUNTER — Encounter (HOSPITAL_BASED_OUTPATIENT_CLINIC_OR_DEPARTMENT_OTHER): Payer: Self-pay | Admitting: *Deleted

## 2018-06-21 ENCOUNTER — Encounter (HOSPITAL_BASED_OUTPATIENT_CLINIC_OR_DEPARTMENT_OTHER)
Admission: RE | Admit: 2018-06-21 | Discharge: 2018-06-21 | Disposition: A | Discharge status: Home / Self Care | Payer: Medicare Other | Source: Ambulatory Visit | Attending: General Surgery | Admitting: General Surgery

## 2018-06-21 DIAGNOSIS — Z1159 Encounter for screening for other viral diseases: Secondary | ICD-10-CM | POA: Insufficient documentation

## 2018-06-21 DIAGNOSIS — Z01810 Encounter for preprocedural cardiovascular examination: Secondary | ICD-10-CM | POA: Insufficient documentation

## 2018-06-21 NOTE — Progress Notes (Signed)
Ensure pre surgery drink given with instructions to complete by [place], surgical soap given with instructions, pt verbalized understanding.

## 2018-06-22 ENCOUNTER — Other Ambulatory Visit: Payer: Self-pay | Admitting: General Surgery

## 2018-06-23 ENCOUNTER — Other Ambulatory Visit (HOSPITAL_COMMUNITY)
Admission: RE | Admit: 2018-06-23 | Discharge: 2018-06-23 | Disposition: A | Discharge status: Home / Self Care | Payer: Medicare Other | Source: Ambulatory Visit | Attending: General Surgery | Admitting: General Surgery

## 2018-06-23 ENCOUNTER — Other Ambulatory Visit: Payer: Self-pay

## 2018-06-23 DIAGNOSIS — Z01810 Encounter for preprocedural cardiovascular examination: Secondary | ICD-10-CM | POA: Diagnosis not present

## 2018-06-24 LAB — NOVEL CORONAVIRUS, NAA (HOSP ORDER, SEND-OUT TO REF LAB; TAT 18-24 HRS): SARS-CoV-2, NAA: NOT DETECTED

## 2018-06-27 ENCOUNTER — Encounter (HOSPITAL_BASED_OUTPATIENT_CLINIC_OR_DEPARTMENT_OTHER): Payer: Self-pay

## 2018-06-27 ENCOUNTER — Ambulatory Visit (HOSPITAL_BASED_OUTPATIENT_CLINIC_OR_DEPARTMENT_OTHER): Payer: Medicare Other | Admitting: Anesthesiology

## 2018-06-27 ENCOUNTER — Other Ambulatory Visit: Payer: Self-pay

## 2018-06-27 ENCOUNTER — Ambulatory Visit (HOSPITAL_BASED_OUTPATIENT_CLINIC_OR_DEPARTMENT_OTHER)
Admission: RE | Admit: 2018-06-27 | Discharge: 2018-06-27 | Disposition: A | Discharge status: Home / Self Care | Payer: Medicare Other | Attending: General Surgery | Admitting: General Surgery

## 2018-06-27 ENCOUNTER — Encounter (HOSPITAL_COMMUNITY)
Admission: RE | Admit: 2018-06-27 | Discharge: 2018-06-27 | Disposition: A | Discharge status: Home / Self Care | Payer: Medicare Other | Source: Ambulatory Visit | Attending: General Surgery | Admitting: General Surgery

## 2018-06-27 ENCOUNTER — Encounter (HOSPITAL_BASED_OUTPATIENT_CLINIC_OR_DEPARTMENT_OTHER)
Admission: RE | Disposition: A | Discharge status: Home / Self Care | Payer: Self-pay | Source: Home / Self Care | Attending: General Surgery

## 2018-06-27 DIAGNOSIS — Z79899 Other long term (current) drug therapy: Secondary | ICD-10-CM | POA: Diagnosis not present

## 2018-06-27 DIAGNOSIS — C50412 Malignant neoplasm of upper-outer quadrant of left female breast: Secondary | ICD-10-CM | POA: Insufficient documentation

## 2018-06-27 DIAGNOSIS — Z8041 Family history of malignant neoplasm of ovary: Secondary | ICD-10-CM | POA: Insufficient documentation

## 2018-06-27 DIAGNOSIS — F419 Anxiety disorder, unspecified: Secondary | ICD-10-CM | POA: Insufficient documentation

## 2018-06-27 DIAGNOSIS — F329 Major depressive disorder, single episode, unspecified: Secondary | ICD-10-CM | POA: Diagnosis not present

## 2018-06-27 DIAGNOSIS — Z170 Estrogen receptor positive status [ER+]: Secondary | ICD-10-CM | POA: Insufficient documentation

## 2018-06-27 DIAGNOSIS — Z7982 Long term (current) use of aspirin: Secondary | ICD-10-CM | POA: Insufficient documentation

## 2018-06-27 DIAGNOSIS — E669 Obesity, unspecified: Secondary | ICD-10-CM | POA: Diagnosis not present

## 2018-06-27 DIAGNOSIS — Z6834 Body mass index (BMI) 34.0-34.9, adult: Secondary | ICD-10-CM | POA: Insufficient documentation

## 2018-06-27 DIAGNOSIS — Z79890 Hormone replacement therapy (postmenopausal): Secondary | ICD-10-CM | POA: Insufficient documentation

## 2018-06-27 DIAGNOSIS — I10 Essential (primary) hypertension: Secondary | ICD-10-CM | POA: Insufficient documentation

## 2018-06-27 DIAGNOSIS — E039 Hypothyroidism, unspecified: Secondary | ICD-10-CM | POA: Diagnosis not present

## 2018-06-27 DIAGNOSIS — Z803 Family history of malignant neoplasm of breast: Secondary | ICD-10-CM | POA: Insufficient documentation

## 2018-06-27 DIAGNOSIS — M1990 Unspecified osteoarthritis, unspecified site: Secondary | ICD-10-CM | POA: Diagnosis not present

## 2018-06-27 HISTORY — PX: BREAST LUMPECTOMY WITH RADIOACTIVE SEED AND SENTINEL LYMPH NODE BIOPSY: SHX6550

## 2018-06-27 SURGERY — BREAST LUMPECTOMY WITH RADIOACTIVE SEED AND SENTINEL LYMPH NODE BIOPSY
Anesthesia: General | Site: Breast | Laterality: Left

## 2018-06-27 MED ORDER — LIDOCAINE 2% (20 MG/ML) 5 ML SYRINGE
INTRAMUSCULAR | Status: AC
  Filled 2018-06-27: qty 5

## 2018-06-27 MED ORDER — PHENYLEPHRINE 40 MCG/ML (10ML) SYRINGE FOR IV PUSH (FOR BLOOD PRESSURE SUPPORT)
PREFILLED_SYRINGE | INTRAVENOUS | Status: AC
  Filled 2018-06-27: qty 20

## 2018-06-27 MED ORDER — FENTANYL CITRATE (PF) 100 MCG/2ML IJ SOLN
INTRAMUSCULAR | Status: AC
  Filled 2018-06-27: qty 2

## 2018-06-27 MED ORDER — GABAPENTIN 100 MG PO CAPS
ORAL_CAPSULE | ORAL | Status: AC
  Filled 2018-06-27: qty 1

## 2018-06-27 MED ORDER — TECHNETIUM TC 99M SULFUR COLLOID FILTERED
1.0000 | Freq: Once | INTRAVENOUS | Status: AC | PRN
  Administered 2018-06-27: 08:00:00 1 via INTRADERMAL

## 2018-06-27 MED ORDER — ACETAMINOPHEN 500 MG PO TABS
1000.0000 mg | ORAL_TABLET | ORAL | Status: AC
  Administered 2018-06-27: 1000 mg via ORAL

## 2018-06-27 MED ORDER — OXYCODONE HCL 5 MG PO TABS: 5.0000 mg | ORAL_TABLET | Freq: Four times a day (QID) | ORAL | 0 refills | Status: DC | PRN

## 2018-06-27 MED ORDER — OXYCODONE HCL 5 MG/5ML PO SOLN: 5.0000 mg | Freq: Once | ORAL | Status: DC | PRN

## 2018-06-27 MED ORDER — CEFAZOLIN SODIUM-DEXTROSE 2-4 GM/100ML-% IV SOLN
INTRAVENOUS | Status: AC
  Filled 2018-06-27: qty 100

## 2018-06-27 MED ORDER — CEFAZOLIN SODIUM-DEXTROSE 2-4 GM/100ML-% IV SOLN
2.0000 g | INTRAVENOUS | Status: AC
  Administered 2018-06-27: 2 g via INTRAVENOUS

## 2018-06-27 MED ORDER — BUPIVACAINE-EPINEPHRINE (PF) 0.5% -1:200000 IJ SOLN
INTRAMUSCULAR | Status: DC | PRN
  Administered 2018-06-27: 30 mL

## 2018-06-27 MED ORDER — FENTANYL CITRATE (PF) 100 MCG/2ML IJ SOLN
INTRAMUSCULAR | Status: DC | PRN
  Administered 2018-06-27: 50 ug via INTRAVENOUS

## 2018-06-27 MED ORDER — OXYCODONE HCL 5 MG PO TABS: 5.0000 mg | ORAL_TABLET | Freq: Once | ORAL | Status: DC | PRN

## 2018-06-27 MED ORDER — LACTATED RINGERS IV SOLN
INTRAVENOUS | Status: DC
  Administered 2018-06-27: 07:00:00 via INTRAVENOUS

## 2018-06-27 MED ORDER — GABAPENTIN 100 MG PO CAPS
100.0000 mg | ORAL_CAPSULE | ORAL | Status: AC
  Administered 2018-06-27: 100 mg via ORAL

## 2018-06-27 MED ORDER — BUPIVACAINE HCL (PF) 0.25 % IJ SOLN
INTRAMUSCULAR | Status: DC | PRN
  Administered 2018-06-27: 4 mL

## 2018-06-27 MED ORDER — DEXAMETHASONE SODIUM PHOSPHATE 10 MG/ML IJ SOLN
INTRAMUSCULAR | Status: AC
  Filled 2018-06-27: qty 1

## 2018-06-27 MED ORDER — ONDANSETRON HCL 4 MG/2ML IJ SOLN
INTRAMUSCULAR | Status: AC
  Filled 2018-06-27: qty 2

## 2018-06-27 MED ORDER — LIDOCAINE 2% (20 MG/ML) 5 ML SYRINGE
INTRAMUSCULAR | Status: DC | PRN
  Administered 2018-06-27: 80 mg via INTRAVENOUS

## 2018-06-27 MED ORDER — ONDANSETRON HCL 4 MG/2ML IJ SOLN: 4.0000 mg | Freq: Once | INTRAMUSCULAR | Status: DC | PRN

## 2018-06-27 MED ORDER — PHENYLEPHRINE 40 MCG/ML (10ML) SYRINGE FOR IV PUSH (FOR BLOOD PRESSURE SUPPORT)
PREFILLED_SYRINGE | INTRAVENOUS | Status: DC | PRN
  Administered 2018-06-27 (×2): 80 ug via INTRAVENOUS

## 2018-06-27 MED ORDER — ENSURE PRE-SURGERY PO LIQD: 296.0000 mL | Freq: Once | ORAL | Status: DC

## 2018-06-27 MED ORDER — FENTANYL CITRATE (PF) 100 MCG/2ML IJ SOLN
50.0000 ug | INTRAMUSCULAR | Status: DC | PRN
  Administered 2018-06-27: 08:00:00 50 ug via INTRAVENOUS

## 2018-06-27 MED ORDER — SCOPOLAMINE 1 MG/3DAYS TD PT72: 1.0000 | MEDICATED_PATCH | Freq: Once | TRANSDERMAL | Status: DC | PRN

## 2018-06-27 MED ORDER — MIDAZOLAM HCL 2 MG/2ML IJ SOLN
INTRAMUSCULAR | Status: AC
  Filled 2018-06-27: qty 2

## 2018-06-27 MED ORDER — ONDANSETRON HCL 4 MG/2ML IJ SOLN
INTRAMUSCULAR | Status: DC | PRN
  Administered 2018-06-27: 4 mg via INTRAVENOUS

## 2018-06-27 MED ORDER — DEXAMETHASONE SODIUM PHOSPHATE 4 MG/ML IJ SOLN
INTRAMUSCULAR | Status: DC | PRN
  Administered 2018-06-27: 10 mg via INTRAVENOUS

## 2018-06-27 MED ORDER — PROPOFOL 10 MG/ML IV BOLUS
INTRAVENOUS | Status: DC | PRN
  Administered 2018-06-27: 150 mg via INTRAVENOUS

## 2018-06-27 MED ORDER — FENTANYL CITRATE (PF) 100 MCG/2ML IJ SOLN: 25.0000 ug | INTRAMUSCULAR | Status: DC | PRN

## 2018-06-27 MED ORDER — MIDAZOLAM HCL 2 MG/2ML IJ SOLN: 1.0000 mg | INTRAMUSCULAR | Status: DC | PRN

## 2018-06-27 MED ORDER — ACETAMINOPHEN 500 MG PO TABS
ORAL_TABLET | ORAL | Status: AC
  Filled 2018-06-27: qty 2

## 2018-06-27 SURGICAL SUPPLY — 66 items
ADH SKN CLS APL DERMABOND .7 (GAUZE/BANDAGES/DRESSINGS) ×1
APL PRP STRL LF DISP 70% ISPRP (MISCELLANEOUS) ×1
APPLIER CLIP 9.375 MED OPEN (MISCELLANEOUS)
APR CLP MED 9.3 20 MLT OPN (MISCELLANEOUS)
BINDER BREAST LRG (GAUZE/BANDAGES/DRESSINGS) IMPLANT
BINDER BREAST MEDIUM (GAUZE/BANDAGES/DRESSINGS) IMPLANT
BINDER BREAST XLRG (GAUZE/BANDAGES/DRESSINGS) IMPLANT
BINDER BREAST XXLRG (GAUZE/BANDAGES/DRESSINGS) ×2 IMPLANT
BLADE SURG 15 STRL LF DISP TIS (BLADE) ×1 IMPLANT
BLADE SURG 15 STRL SS (BLADE) ×3
CANISTER SUC SOCK COL 7IN (MISCELLANEOUS) IMPLANT
CANISTER SUCT 1200ML W/VALVE (MISCELLANEOUS) IMPLANT
CHLORAPREP W/TINT 26 (MISCELLANEOUS) ×3 IMPLANT
CLIP APPLIE 9.375 MED OPEN (MISCELLANEOUS) IMPLANT
CLIP VESOCCLUDE SM WIDE 6/CT (CLIP) ×3 IMPLANT
CLOSURE WOUND 1/2 X4 (GAUZE/BANDAGES/DRESSINGS) ×1
COVER BACK TABLE REUSABLE LG (DRAPES) ×3 IMPLANT
COVER MAYO STAND REUSABLE (DRAPES) ×3 IMPLANT
COVER PROBE W GEL 5X96 (DRAPES) ×3 IMPLANT
COVER WAND RF STERILE (DRAPES) IMPLANT
DECANTER SPIKE VIAL GLASS SM (MISCELLANEOUS) IMPLANT
DERMABOND ADVANCED (GAUZE/BANDAGES/DRESSINGS) ×2
DERMABOND ADVANCED .7 DNX12 (GAUZE/BANDAGES/DRESSINGS) ×1 IMPLANT
DRAPE LAPAROSCOPIC ABDOMINAL (DRAPES) ×3 IMPLANT
DRAPE UTILITY XL STRL (DRAPES) ×3 IMPLANT
ELECT COATED BLADE 2.86 ST (ELECTRODE) ×3 IMPLANT
ELECT REM PT RETURN 9FT ADLT (ELECTROSURGICAL) ×3
ELECTRODE REM PT RTRN 9FT ADLT (ELECTROSURGICAL) ×1 IMPLANT
GLOVE BIO SURGEON STRL SZ 6.5 (GLOVE) ×1 IMPLANT
GLOVE BIO SURGEON STRL SZ7 (GLOVE) ×6 IMPLANT
GLOVE BIO SURGEONS STRL SZ 6.5 (GLOVE) ×1
GLOVE BIOGEL PI IND STRL 7.0 (GLOVE) IMPLANT
GLOVE BIOGEL PI IND STRL 7.5 (GLOVE) ×1 IMPLANT
GLOVE BIOGEL PI INDICATOR 7.0 (GLOVE) ×2
GLOVE BIOGEL PI INDICATOR 7.5 (GLOVE) ×2
GOWN STRL REUS W/ TWL LRG LVL3 (GOWN DISPOSABLE) ×2 IMPLANT
GOWN STRL REUS W/TWL LRG LVL3 (GOWN DISPOSABLE) ×6
HEMOSTAT ARISTA ABSORB 3G PWDR (HEMOSTASIS) IMPLANT
ILLUMINATOR WAVEGUIDE N/F (MISCELLANEOUS) ×2 IMPLANT
KIT MARKER MARGIN INK (KITS) ×3 IMPLANT
LIGHT WAVEGUIDE WIDE FLAT (MISCELLANEOUS) IMPLANT
NDL HYPO 25X1 1.5 SAFETY (NEEDLE) ×1 IMPLANT
NDL SAFETY ECLIPSE 18X1.5 (NEEDLE) IMPLANT
NEEDLE HYPO 18GX1.5 SHARP (NEEDLE)
NEEDLE HYPO 25X1 1.5 SAFETY (NEEDLE) ×3 IMPLANT
NS IRRIG 1000ML POUR BTL (IV SOLUTION) ×2 IMPLANT
PACK BASIN DAY SURGERY FS (CUSTOM PROCEDURE TRAY) ×3 IMPLANT
PENCIL BUTTON HOLSTER BLD 10FT (ELECTRODE) ×3 IMPLANT
SLEEVE SCD COMPRESS KNEE MED (MISCELLANEOUS) ×3 IMPLANT
SPONGE LAP 4X18 RFD (DISPOSABLE) ×3 IMPLANT
STRIP CLOSURE SKIN 1/2X4 (GAUZE/BANDAGES/DRESSINGS) ×2 IMPLANT
SUT ETHILON 2 0 FS 18 (SUTURE) IMPLANT
SUT MNCRL AB 4-0 PS2 18 (SUTURE) ×3 IMPLANT
SUT MON AB 5-0 PS2 18 (SUTURE) IMPLANT
SUT SILK 2 0 SH (SUTURE) IMPLANT
SUT VIC AB 2-0 SH 27 (SUTURE) ×9
SUT VIC AB 2-0 SH 27XBRD (SUTURE) ×1 IMPLANT
SUT VIC AB 3-0 SH 27 (SUTURE) ×3
SUT VIC AB 3-0 SH 27X BRD (SUTURE) ×1 IMPLANT
SUT VIC AB 5-0 PS2 18 (SUTURE) IMPLANT
SYR CONTROL 10ML LL (SYRINGE) ×3 IMPLANT
TOWEL GREEN STERILE FF (TOWEL DISPOSABLE) ×3 IMPLANT
TRAY FAXITRON CT DISP (TRAY / TRAY PROCEDURE) ×3 IMPLANT
TUBE CONNECTING 20'X1/4 (TUBING)
TUBE CONNECTING 20X1/4 (TUBING) IMPLANT
YANKAUER SUCT BULB TIP NO VENT (SUCTIONS) IMPLANT

## 2018-06-27 NOTE — Anesthesia Preprocedure Evaluation (Addendum)
Anesthesia Evaluation  Patient identified by MRN, date of birth, ID band Patient awake    Reviewed: Allergy & Precautions, NPO status , Patient's Chart, lab work & pertinent test results  History of Anesthesia Complications Negative for: history of anesthetic complications  Airway Mallampati: II  TM Distance: >3 FB Neck ROM: Full    Dental  (+) Dental Advisory Given, Teeth Intact   Pulmonary neg pulmonary ROS,    breath sounds clear to auscultation       Cardiovascular hypertension, Pt. on medications  Rhythm:Regular Rate:Normal     Neuro/Psych  Headaches, PSYCHIATRIC DISORDERS Anxiety Depression    GI/Hepatic negative GI ROS, Neg liver ROS,   Endo/Other  Hypothyroidism  Obesity   Renal/GU negative Renal ROS     Musculoskeletal  (+) Arthritis ,   Abdominal   Peds  Hematology negative hematology ROS (+)   Anesthesia Other Findings   Reproductive/Obstetrics  Breast cancer                             Anesthesia Physical Anesthesia Plan  ASA: II  Anesthesia Plan: General   Post-op Pain Management:  Regional for Post-op pain   Induction: Intravenous  PONV Risk Score and Plan: 3 and Treatment may vary due to age or medical condition, Ondansetron and Dexamethasone  Airway Management Planned: LMA  Additional Equipment: None  Intra-op Plan:   Post-operative Plan: Extubation in OR  Informed Consent: I have reviewed the patients History and Physical, chart, labs and discussed the procedure including the risks, benefits and alternatives for the proposed anesthesia with the patient or authorized representative who has indicated his/her understanding and acceptance.     Dental advisory given  Plan Discussed with: CRNA and Anesthesiologist  Anesthesia Plan Comments:        Anesthesia Quick Evaluation

## 2018-06-27 NOTE — Transfer of Care (Signed)
Immediate Anesthesia Transfer of Care Note  Patient: Erika Huynh  Procedure(s) Performed: LEFT BREAST LUMPECTOMY WITH RADIOACTIVE SEED AND LEFT AXILLARY SENTINEL LYMPH NODE BIOPSY (Left Breast)  Patient Location: PACU  Anesthesia Type:General  Level of Consciousness: awake and alert   Airway & Oxygen Therapy: Patient Spontanous Breathing and Patient connected to nasal cannula oxygen  Post-op Assessment: Report given to RN and Post -op Vital signs reviewed and stable  Post vital signs: Reviewed and stable  Last Vitals:  Vitals Value Taken Time  BP    Temp    Pulse 76 06/27/2018  9:34 AM  Resp 11 06/27/2018  9:34 AM  SpO2 100 % 06/27/2018  9:34 AM  Vitals shown include unvalidated device data.  Last Pain:  Vitals:   06/27/18 0706  TempSrc: Oral  PainSc: 0-No pain         Complications: No apparent anesthesia complications

## 2018-06-27 NOTE — Progress Notes (Signed)
Assisted Dr. Fransisco Beau with left, ultrasound guided, pectoralis block. Side rails up, monitors on throughout procedure. See vital signs in flow sheet. Tolerated Procedure well.

## 2018-06-27 NOTE — H&P (Signed)
  6 yof referred by Dr Isidore Moos for a new left breast cancer. she has no prior breast history except for a benign breast mass excision. she has fh of paternal gm with ovarian cancer and aunt with breast cancer. she has no mass or dc. she does not smoke. she has history of thyroid disease and hypertension. she had screening mm that shows a left breast asymmetry. there is 60mm area on mm and a 6 mm area on [place]. [place] of axilla negative. her breast tissue is not dense. 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 she is here to discuss options  Allergies  Demerol *ANALGESICS - OPIOID*  Nausea. Propoxyphene Compound *ANALGESICS - OPIOID*  Nausea.  Medication History  Medications Reconciled Aspirin (325MG  Tablet DR, Oral) Active. Azelastine HCl (0.15% Solution, Nasal) Active. Lexapro (20MG  Tablet, Oral) Active. Cozaar (100MG  Tablet, Oral) Active. Antivert (25MG  Tablet, Oral) Active. Melatonin (10MG  Tablet, Oral) Active. Pataday (0.2% Solution, Ophthalmic) Active. Synthroid (150MCG Tablet, Oral) Active. Oxymetazoline HCl (0.05% Solution, Nasal) Active.  Social History  Current tobacco use  Never smoker.  Family History  Breast Cancer  Paternal Grandmother, Maternal Aunt.  Review of Systems  All other systems negative  Physical Exam  General Mental Status-Alert. Head and Neck Trachea-midline. Thyroid Gland Characteristics - normal size and consistency. Eye Sclera/Conjunctiva - Bilateral-No scleral icterus. Chest and Lung Exam Chest and lung exam reveals -quiet, even and easy respiratory effort with no use of accessory muscles. Breast Nipples-No Discharge. Breast Lump-No Palpable Breast Mass. Cardiovascular Cardiovascular examination reveals -normal heart sounds, regular rate and rhythm with no murmurs. Abdomen Note: soft nt/no hepatomegaly Neurologic Neurologic evaluation reveals -alert and oriented x 3 with no  impairment of recent or remote memory. Lymphatic Head & Neck General Head & Neck Lymphatics: Bilateral - Description - Normal. Axillary General Axillary Region: Bilateral - Description - Normal. Note: no Bernie adenopathy  Assessment & Plan  BREAST CANCER OF UPPER-OUTER QUADRANT OF LEFT FEMALE BREAST (C50.412) Story: Left breast seed guided lumpectomy, left axillary sn biopsy We discussed staging and pathophysiology of breast cancer and all available treatment options. we discussed sn biopsy as she does not appear to have nodal involvement. will await final results. we discussed risk of lymphedema and shoulder dysfunction. we also discussed lumpectomy vs mastectomy. we discussed no real difference in local recurrence. we discussed lumpectomy with seed guidance and 5-10% chance of pos margin. I recommended lumpectomy. we discussed surgery, risks and recovery as well as possibility of radiotherapy postop pending surgery pathology. will schedule soon

## 2018-06-27 NOTE — Anesthesia Procedure Notes (Signed)
Procedure Name: LMA Insertion Date/Time: 06/27/2018 8:39 AM Performed by: Lieutenant Diego, CRNA Pre-anesthesia Checklist: Patient identified, Emergency Drugs available, Suction available and Patient being monitored Patient Re-evaluated:Patient Re-evaluated prior to induction Oxygen Delivery Method: Circle system utilized Preoxygenation: Pre-oxygenation with 100% oxygen Induction Type: IV induction Ventilation: Mask ventilation without difficulty LMA: LMA inserted LMA Size: 4.0 Number of attempts: 1 Placement Confirmation: positive ETCO2 and breath sounds checked- equal and bilateral Tube secured with: Tape Dental Injury: Teeth and Oropharynx as per pre-operative assessment

## 2018-06-27 NOTE — Anesthesia Postprocedure Evaluation (Signed)
Anesthesia Post Note  Patient: CLEDA IMEL  Procedure(s) Performed: LEFT BREAST LUMPECTOMY WITH RADIOACTIVE SEED AND LEFT AXILLARY SENTINEL LYMPH NODE BIOPSY (Left Breast)     Patient location during evaluation: PACU Anesthesia Type: General Level of consciousness: awake and alert Pain management: pain level controlled Vital Signs Assessment: post-procedure vital signs reviewed and stable Respiratory status: spontaneous breathing, nonlabored ventilation and respiratory function stable Cardiovascular status: blood pressure returned to baseline and stable Postop Assessment: no apparent nausea or vomiting Anesthetic complications: no    Last Vitals:  Vitals:   06/27/18 1015 06/27/18 1030  BP: 136/78 136/78  Pulse: 70 81  Resp: 16 16  Temp:  36.8 C  SpO2: 97% 98%    Last Pain:  Vitals:   06/27/18 1030  TempSrc:   PainSc: 0-No pain                 Audry Pili

## 2018-06-27 NOTE — Anesthesia Procedure Notes (Signed)
Anesthesia Regional Block: Pectoralis block   Pre-Anesthetic Checklist: ,, timeout performed, Correct Patient, Correct Site, Correct Laterality, Correct Procedure, Correct Position, site marked, Risks and benefits discussed,  Surgical consent,  Pre-op evaluation,  At surgeon's request and post-op pain management  Laterality: Left  Prep: chloraprep       Needles:  Injection technique: Single-shot  Needle Type: Echogenic Needle     Needle Length: 10cm  Needle Gauge: 21     Additional Needles:   Narrative:  Start time: 06/27/2018 8:01 AM End time: 06/27/2018 8:05 AM Injection made incrementally with aspirations every 5 mL.  Performed by: Personally  Anesthesiologist: Audry Pili, MD  Additional Notes: No pain on injection. No increased resistance to injection. Injection made in 5cc increments. Good needle visualization. Patient tolerated the procedure well.

## 2018-06-27 NOTE — Discharge Instructions (Signed)
[place] Office Phone Number [phone]  POST OP INSTRUCTIONS Take 400 mg of ibuprofen every 8 hours or 650 mg tylenol every 6 hours for next 72 hours then as needed. Use ice several times daily also. Always review your discharge instruction sheet given to you by the facility where your surgery was performed.  IF YOU HAVE DISABILITY OR FAMILY LEAVE FORMS, YOU MUST BRING THEM TO THE OFFICE FOR PROCESSING.  DO NOT GIVE THEM TO YOUR DOCTOR.  1. A prescription for pain medication may be given to you upon discharge.  Take your pain medication as prescribed, if needed.  If narcotic pain medicine is not needed, then you may take acetaminophen (Tylenol), naprosyn (Alleve) or ibuprofen (Advil) as needed. 2. Take your usually prescribed medications unless otherwise directed 3. If you need a refill on your pain medication, please contact your pharmacy.  They will contact our office to request authorization.  Prescriptions will not be filled after 5pm or on week-ends. 4. You should eat very light the first 24 hours after surgery, such as soup, crackers, pudding, etc.  Resume your normal diet the day after surgery. 5. Most patients will experience some swelling and bruising in the breast.  Ice packs and a good support bra will help.  Wear the breast binder provided or a sports bra for 72 hours day and night.  After that wear a sports bra during the day until you return to the office. Swelling and bruising can take several days to resolve.  6. It is common to experience some constipation if taking pain medication after surgery.  Increasing fluid intake and taking a stool softener will usually help or prevent this problem from occurring.  A mild laxative (Milk of Magnesia or Miralax) should be taken according to package directions if there are no bowel movements after 48 hours. 7. Unless discharge instructions indicate otherwise, you may remove your bandages 48 hours after surgery and you may  shower at that time.  You may have steri-strips (small skin tapes) in place directly over the incision.  These strips should be left on the skin for 7-10 days and will come off on their own.  If your surgeon used skin glue on the incision, you may shower in 24 hours.  The glue will flake off over the next 2-3 weeks.  Any sutures or staples will be removed at the office during your follow-up visit. 8. ACTIVITIES:  You may resume regular daily activities (gradually increasing) beginning the next day.  Wearing a good support bra or sports bra minimizes pain and swelling.  You may have sexual intercourse when it is comfortable. a. You may drive when you no longer are taking prescription pain medication, you can comfortably wear a seatbelt, and you can safely maneuver your car and apply brakes. b. RETURN TO WORK:  ______________________________________________________________________________________ 9. You should see your doctor in the office for a follow-up appointment approximately two weeks after your surgery.  Your doctors nurse will typically make your follow-up appointment when she calls you with your pathology report.  Expect your pathology report 3-4 business days after your surgery.  You may call to check if you do not hear from [place] after three days. 10. OTHER INSTRUCTIONS: _______________________________________________________________________________________________ _____________________________________________________________________________________________________________________________________ _____________________________________________________________________________________________________________________________________ _____________________________________________________________________________________________________________________________________  WHEN TO CALL DR WAKEFIELD: 1. Fever over 101.0 2. Nausea and/or vomiting. 3. Extreme swelling or bruising. 4. Continued bleeding from  incision. 5. Increased pain, redness, or drainage from the incision.  The clinic staff is available to  answer your questions during regular business hours.  Please dont hesitate to call and ask to speak to one of the nurses for clinical concerns.  If you have a medical emergency, go to the nearest emergency room or call 911.  A surgeon from [facility] is always on call at the hospital.  For further questions, please visit [email or web address]   Post Anesthesia Home Care Instructions  Activity: Get plenty of rest for the remainder of the day. A responsible individual must stay with you for 24 hours following the procedure.  For the next 24 hours, DO NOT: -Drive a car -Paediatric nurse -Drink alcoholic beverages -Take any medication unless instructed by your physician -Make any legal decisions or sign important papers.  Meals: Start with liquid foods such as gelatin or soup. Progress to regular foods as tolerated. Avoid greasy, spicy, heavy foods. If nausea and/or vomiting occur, drink only clear liquids until the nausea and/or vomiting subsides. Call your physician if vomiting continues.  Special Instructions/Symptoms: Your throat may feel dry or sore from the anesthesia or the breathing tube placed in your throat during surgery. If this causes discomfort, gargle with warm salt water. The discomfort should disappear within 24 hours.  If you had a scopolamine patch placed behind your ear for the management of post- operative nausea and/or vomiting:  1. The medication in the patch is effective for 72 hours, after which it should be removed.  Wrap patch in a tissue and discard in the trash. Wash hands thoroughly with soap and water. 2. You may remove the patch earlier than 72 hours if you experience unpleasant side effects which may include dry mouth, dizziness or visual disturbances. 3. Avoid touching the patch. Wash your hands with soap and water after contact  with the patch.    Post Anesthesia Home Care Instructions  Activity: Get plenty of rest for the remainder of the day. A responsible individual must stay with you for 24 hours following the procedure.  For the next 24 hours, DO NOT: -Drive a car -Paediatric nurse -Drink alcoholic beverages -Take any medication unless instructed by your physician -Make any legal decisions or sign important papers.  Meals: Start with liquid foods such as gelatin or soup. Progress to regular foods as tolerated. Avoid greasy, spicy, heavy foods. If nausea and/or vomiting occur, drink only clear liquids until the nausea and/or vomiting subsides. Call your physician if vomiting continues.  Special Instructions/Symptoms: Your throat may feel dry or sore from the anesthesia or the breathing tube placed in your throat during surgery. If this causes discomfort, gargle with warm salt water. The discomfort should disappear within 24 hours.  If you had a scopolamine patch placed behind your ear for the management of post- operative nausea and/or vomiting:  1. The medication in the patch is effective for 72 hours, after which it should be removed.  Wrap patch in a tissue and discard in the trash. Wash hands thoroughly with soap and water. 2. You may remove the patch earlier than 72 hours if you experience unpleasant side effects which may include dry mouth, dizziness or visual disturbances. 3. Avoid touching the patch. Wash your hands with soap and water after contact with the patch.

## 2018-06-27 NOTE — Interval H&P Note (Signed)
History and Physical Interval Note:  06/27/2018 8:24 AM  Erika Huynh  has presented today for surgery, with the diagnosis of left breast cancer.  The various methods of treatment have been discussed with the patient and family. After consideration of risks, benefits and other options for treatment, the patient has consented to  Procedure(s): LEFT BREAST LUMPECTOMY WITH RADIOACTIVE SEED AND LEFT AXILLARY SENTINEL LYMPH NODE BIOPSY (Left) as a surgical intervention.  The patient's history has been reviewed, patient examined, no change in status, stable for surgery.  I have reviewed the patient's chart and labs.  Questions were answered to the patient's satisfaction.     Rolm Bookbinder

## 2018-06-27 NOTE — Progress Notes (Signed)
Assisted Pamala Hurry, nuc med tech, with nuc med injections. Side rails up, monitors on throughout procedure. See vital signs in flow sheet. Tolerated Procedure well.

## 2018-06-27 NOTE — Op Note (Signed)
Preoperative diagnosis: clinical stage I left breast cancer Postoperative diagnosis: same as above Procedure: 1. Left breast seed guided lumpectomy 2. Left deep axillary sentinel node biopsy Surgeon: Dr Serita Grammes Anesthesia: general with pectoral block Specimens: 1. Left breast marked with paint containing seed and clip 2. Left axillary sentinel nodes 3. Additional left breast medial margin marked short superior, long lateral and double deep EBL: 10 cc Complications none Drains none Sponge and needle count correct times two dispo to recovery stable  Indications: this is 67 yof who has clinical stage I left breast cancer.  We discussed options and decided to do seed guided lumpectomy, axillary sentinel node biopsy  Procedure: After informed consent was obtained, she had a seed placed first. I had these mammograms in the OR.  She was given antibiotics. SCDs were in place.  She underwent a pectoral block. She was placed under general anesthesia.  She was prepped and draped in standard sterile surgical fashion.  I located the radioactive seed in the far left upper outer quadrant.  I elected to make a single curvilinear incision in the upper outer quadrant in the low axilla.  I then dissected using the neoprobe and the lighted retractor to the seed.  I remove the seed and some surrounding tissue in order to get a clear margin.  I did a 3D mammogram after this and it appeared my seed was in the middle of my specimen.  There was some medial tissue that I was somewhat concerned about and I removed an additional medial margin and marked it as above.  I then obtained hemostasis.  Clips were placed.  I then was able to identify the sentinel node from the axilla.  I then dissected into the axilla and remove what looked to be a couple of sentinel lymph nodes with the highest count of 220.  There was no background radioactivity.  There were no palpable nodes.  I then closed the axilla with 2-0 Vicryl.  I  placed clips in the breast cavity.  I closed the breast cavity with 2-0 Vicryl.  The incision then was closed with 3-0 Vicryl and 4-0 Monocryl.  Glue and Steri-Strips were applied.  She tolerated this well was extubated and transferred to recovery stable.

## 2018-06-28 ENCOUNTER — Encounter (HOSPITAL_BASED_OUTPATIENT_CLINIC_OR_DEPARTMENT_OTHER): Payer: Self-pay | Admitting: General Surgery

## 2018-06-28 NOTE — Assessment & Plan Note (Signed)
04/17/2018:Screening detected left breast asymmetry 0.5 cm by mammogram, ultrasound revealed 0.6 cm mass at 3 o'clock position axilla negative, biopsy revealed grade 2 ILC, ER 90%, PR 90%, Ki-67 1%, HER-2 -1+, T1b N0 stage Ia clinical stage Because surgery was delayed due to COVID-19, we started anastrozole 05/08/2018  06/27/2018: Left lumpectomy:  Pathology counseling: I discussed the final pathology report of the patient provided  a copy of this report. I discussed the margins as well as lymph node surgeries. We also discussed the final staging along with previously performed ER/PR and HER-2/neu testing.  Recommendation: Tumor board discussion 1.  Plus or minus radiation 2.  Adjuvant antiestrogen therapy with anastrozole  Genetics consultation was recommended.

## 2018-06-29 ENCOUNTER — Telehealth: Payer: Self-pay | Admitting: Hematology and Oncology

## 2018-06-29 NOTE — Telephone Encounter (Signed)
Spoke with pt re office visit for 07/04/18 and she agrees to a phone call instead of office visit.

## 2018-06-30 ENCOUNTER — Telehealth: Payer: Self-pay | Admitting: *Deleted

## 2018-06-30 ENCOUNTER — Telehealth: Payer: Self-pay | Admitting: Hematology and Oncology

## 2018-06-30 NOTE — Telephone Encounter (Signed)
Scheduled appt per 5/15 sch message - unable to reach patient or leave message - sent message to MD and RN Arrie Aran

## 2018-06-30 NOTE — Telephone Encounter (Signed)
Called pt to discuss future appts and next steps. Unable to leave msg d/t mailbox full. Sent scheduling request to cx [facility] appt and to schedule SCP in 3 mon.

## 2018-07-03 ENCOUNTER — Encounter: Payer: Self-pay | Admitting: *Deleted

## 2018-07-03 ENCOUNTER — Telehealth: Payer: Self-pay | Admitting: *Deleted

## 2018-07-03 NOTE — Progress Notes (Signed)
HEMATOLOGY-ONCOLOGY TELEPHONE VISIT PROGRESS NOTE  I connected with Erika Huynh on 07/04/2018 at 11:30 AM EDT by telephone and verified that I am speaking with the correct person using two identifiers.  I discussed the limitations, risks, security and privacy concerns of performing an evaluation and management service by telephone and the availability of in person appointments.  I also discussed with the patient that there may be a patient responsible charge related to this service. The patient expressed understanding and agreed to proceed.   History of Present Illness: Erika Huynh is a 73 y.o. female with above-mentioned history of left breast cancer. Genetic testing was negative. Surgery was postponed due to the COVID-19 pandemic. She was started on neoadjuvant anti-estrogen therapy with anastrozole on 05/08/18. She underwent a lumpectomy on 06/27/18 with Dr. Donne Hazel. Pathology confirmed invasive lobular carcinoma with DCIS, grade 2, 0.8cm, ER+ (90%), PR+ (90%), HER2- (1+), Ki67 1%, clear margins, 0/5 SNL negative for carcinoma. She is over the phone today to review the pathology report.     Malignant neoplasm of upper-outer quadrant of left breast in female, estrogen receptor positive ([place])   04/17/2018 Initial Diagnosis    Screening detected left breast asymmetry 0.5 cm by mammogram, ultrasound revealed 0.6 cm mass at 3 o'clock position axilla negative, biopsy revealed grade 2 ILC, ER 90%, PR 90%, Ki-67 1%, HER-2 -1+, T1b N0 stage Ia clinical stage    04/26/2018 Cancer Staging    Staging form: Breast, AJCC 8th Edition - Clinical stage from 04/26/2018: Stage IA (cT1b, cN0, cM0, G2, ER+, PR+, HER2-) - Signed by Nicholas Lose, MD on 04/26/2018    05/08/2018 -  Anti-estrogen oral therapy    Anastrozole 1 mg daily    06/27/2018 Surgery    Right lumpectomy Donne Hazel): invasive lobular carcinoma with DCIS, grade 2, 0.8cm, ER+ (90%), PR+ (90%), HER2- (1+), Ki67 1%, clear margins, 0/5 SNL  negative for carcinoma.      Observations/Objective: Recovering very well from recent surgery denies any pain    Assessment Plan:  Malignant neoplasm of upper-outer quadrant of left breast in female, estrogen receptor positive ([place]) 04/17/2018:Screening detected left breast asymmetry 0.5 cm by mammogram, ultrasound revealed 0.6 cm mass at 3 o'clock position axilla negative, biopsy revealed grade 2 ILC, ER 90%, PR 90%, Ki-67 1%, HER-2 -1+, T1b N0 stage Ia clinical stage Because surgery was delayed due to COVID-19, we started anastrozole 05/08/2018  06/27/2018: Left lumpectomy: 0.8 cm grade 2 IDC with DCIS 0/5 lymph nodes negative, ER 90%, PR 90%, HER-2 -1+, Ki-67 1%, T1BN0 stage Ia  Pathology counseling: I discussed the final pathology report of the patient provided  a copy of this report. I discussed the margins as well as lymph node surgeries. We also discussed the final staging along with previously performed ER/PR and HER-2/neu testing.  Recommendation: Tumor board discussion 1.  Plus or minus radiation 2.  Adjuvant antiestrogen therapy with anastrozole  Anastrozole Toxicities: None  Genetics: Neg for mutations. Return to clinic in 3 months for survivorship care plan visit   I discussed the assessment and treatment plan with the patient. The patient was provided an opportunity to ask questions and all were answered. The patient agreed with the plan and demonstrated an understanding of the instructions. The patient was advised to call back or seek an in-person evaluation if the symptoms worsen or if the condition fails to improve as anticipated.   I provided 12 minutes of non-face-to-face time during this encounter.   Rulon Eisenmenger,  MD 07/04/2018    I, Cloyde Reams Dorshimer, am acting as scribe for Nicholas Lose, MD.  I have reviewed the above documentation for accuracy and completeness, and I agree with the above.

## 2018-07-03 NOTE — Telephone Encounter (Signed)
Called pt to discuss future appts. Unable to leave msg d/t mailbox is full.

## 2018-07-04 ENCOUNTER — Inpatient Hospital Stay: Payer: Medicare Other | Attending: Hematology and Oncology | Admitting: Hematology and Oncology

## 2018-07-04 DIAGNOSIS — C50412 Malignant neoplasm of upper-outer quadrant of left female breast: Secondary | ICD-10-CM | POA: Diagnosis not present

## 2018-07-04 DIAGNOSIS — Z79899 Other long term (current) drug therapy: Secondary | ICD-10-CM

## 2018-07-04 DIAGNOSIS — Z170 Estrogen receptor positive status [ER+]: Secondary | ICD-10-CM

## 2018-07-13 ENCOUNTER — Telehealth: Payer: Self-pay | Admitting: *Deleted

## 2018-07-13 NOTE — Telephone Encounter (Signed)
Spoke to pt regarding tumor board discussion recommendations from 5.27.20. Informed pt that no radiation is recommended and to continue anastrozole. Received verbal understanding.

## 2018-07-14 ENCOUNTER — Ambulatory Visit: Payer: Medicare Other

## 2018-07-14 ENCOUNTER — Ambulatory Visit: Payer: Medicare Other | Admitting: Radiation Oncology

## 2018-08-02 ENCOUNTER — Ambulatory Visit (HOSPITAL_COMMUNITY): Payer: Medicare Other

## 2018-09-11 ENCOUNTER — Telehealth: Payer: Self-pay | Admitting: Adult Health

## 2018-09-11 NOTE — Telephone Encounter (Signed)
I talk with patient regarding schedule  

## 2018-10-03 ENCOUNTER — Inpatient Hospital Stay: Payer: Medicare Other | Attending: Hematology and Oncology | Admitting: Adult Health

## 2018-10-03 ENCOUNTER — Telehealth: Payer: Self-pay

## 2018-10-03 ENCOUNTER — Encounter: Payer: Self-pay | Admitting: Adult Health

## 2018-10-03 DIAGNOSIS — C50412 Malignant neoplasm of upper-outer quadrant of left female breast: Secondary | ICD-10-CM

## 2018-10-03 DIAGNOSIS — Z170 Estrogen receptor positive status [ER+]: Secondary | ICD-10-CM

## 2018-10-03 NOTE — Telephone Encounter (Signed)
please call patient and let her know the following Mammogram due in 04/2019 at [facility] Will you fax orders? Also she needs bone density every 2 years while on Anastrozole. Would recommend starting this after she has been on for 1-2 years, or at Dr. Orest Dikes discretion. Her bone density was very good, so no need for repeat baseline.   Above note from Wilber Bihari NP via [place]  Per Mendel Ryder Pt called and informed she will need a repeat mammogram  March of 2021, informed Pt. she will get a call from [facility] to confirm date and time, also informed Pt of Dexa scan results. Explained she will need Dexa scan every 1-2 yrs at Dr. Orest Dikes discretion. Pt verbalized understanding no other problems or concerns noted.

## 2018-10-03 NOTE — Addendum Note (Signed)
Addended by: Scot Dock on: 10/03/2018 11:14 AM   Modules accepted: Orders

## 2018-10-03 NOTE — Progress Notes (Addendum)
SURVIVORSHIP VIRTUAL VISIT:  I connected with Adelynn Gipe on 10/03/18 at 10:00 AM EDT by telephone and verified that I am speaking with the correct person using two identifiers.  I discussed the limitations, risks, security and privacy concerns of performing an evaluation and management service by telephone and the availability of in person appointments. I also discussed with the patient that there may be a patient responsible charge related to this service. The patient expressed understanding and agreed to proceed.   BRIEF ONCOLOGIC HISTORY:  Oncology History  Malignant neoplasm of upper-outer quadrant of left breast in female, estrogen receptor positive ([place])  04/17/2018 Initial Diagnosis   Screening detected left breast asymmetry 0.5 cm by mammogram, ultrasound revealed 0.6 cm mass at 3 o'clock position axilla negative, biopsy revealed grade 2 ILC, ER 90%, PR 90%, Ki-67 1%, HER-2 -1+, T1b N0 stage Ia clinical stage   04/26/2018 Cancer Staging   Staging form: Breast, AJCC 8th Edition - Clinical stage from 04/26/2018: Stage IA (cT1b, cN0, cM0, G2, ER+, PR+, HER2-) - Signed by Nicholas Lose, MD on 04/26/2018   04/26/2018 Genetic Testing   Negative.  Genes tested include: ATM, BRCA1, BRCA2, CDH1, CHEK2, PALB2, PTEN, STK11 and TP53.  APC, ATM, AXIN2, BARD1, BMPR1A, BRCA1, BRCA2, BRIP1, CDH1, CDKN2A (p14ARF), CDKN2A (p16INK4a), CKD4, CHEK2, CTNNA1, DICER1, EPCAM (Deletion/duplication testing only), GREM1 (promoter region deletion/duplication testing only), KIT, MEN1, MLH1, MSH2, MSH3, MSH6, MUTYH, NBN, NF1, NHTL1, PALB2, PDGFRA, PMS2, POLD1, POLE, PTEN, RAD50, RAD51C, RAD51D, SDHB, SDHC, SDHD, SMAD4, SMARCA4. STK11, TP53, TSC1, TSC2, and VHL.  The following genes were evaluated for sequence changes only: SDHA and HOXB13 c.251G>A variant only.   05/08/2018 -  Anti-estrogen oral therapy   Anastrozole 1 mg daily   06/27/2018 Surgery   Right lumpectomy Donne Hazel): invasive lobular carcinoma with DCIS,  grade 2, 0.8cm, ER+ (90%), PR+ (90%), HER2- (1+), Ki67 1%, clear margins, 0/5 SNL negative for carcinoma.    07/12/2018 Cancer Staging   Staging form: Breast, AJCC 8th Edition - Pathologic: Stage IA (pT1b, pN0, cM0, G2, ER+, PR+, HER2-) - Signed by Gardenia Phlegm, NP on 07/12/2018     INTERVAL HISTORY:  Ms. Manuele to review her survivorship care plan detailing her treatment course for breast cancer, as well as monitoring long-term side effects of that treatment, education regarding health maintenance, screening, and overall wellness and health promotion.     Overall, Ms. Lingo reports feeling quite well.  She is taking Anastrozole daily.  She denies any arthralgias, hot flashes, or vaginal dryness.  She has some residual pain in her breast occasionally.  She has remained active.  She says that she hasn't been doing much because of the heat.    Henretter notes that she had a bilateral mammogram prior to surgery.  This is not visible in her chart.  She had this at [facility].  REVIEW OF SYSTEMS:  Review of Systems  Constitutional: Negative for appetite change, chills, fatigue, fever and unexpected weight change.  HENT:   Negative for hearing loss, lump/mass, mouth sores, sore throat and trouble swallowing.   Eyes: Negative for eye problems and icterus.  Respiratory: Negative for chest tightness, cough and shortness of breath.   Cardiovascular: Negative for chest pain, leg swelling and palpitations.  Gastrointestinal: Negative for abdominal distention, abdominal pain, constipation, diarrhea, nausea and vomiting.  Endocrine: Negative for hot flashes.  Genitourinary: Negative for difficulty urinating.   Musculoskeletal: Negative for arthralgias.  Skin: Negative for itching and rash.  Neurological: Negative for dizziness, extremity  weakness, headaches and numbness.  Hematological: Negative for adenopathy. Does not bruise/bleed easily.  Psychiatric/Behavioral: Negative for depression. The  patient is not nervous/anxious.   Breast: Denies any new nodularity, masses, tenderness, nipple changes, or nipple discharge.      ONCOLOGY TREATMENT TEAM:  1. Surgeon:  Dr. Donne Hazel at [facility] 2. Medical Oncologist: Dr. Lindi Adie      PAST MEDICAL/SURGICAL HISTORY:  Past Medical History:  Diagnosis Date  . Anxiety   . Arthritis   . Depression   . Family history of breast cancer   . Family history of lung cancer   . Family history of pancreatic cancer   . Family history of throat cancer   . Headache   . Hypertension   . Hypothyroidism    Past Surgical History:  Procedure Laterality Date  . ABDOMINAL HYSTERECTOMY    . ADENOIDECTOMY    . BREAST LUMPECTOMY WITH RADIOACTIVE SEED AND SENTINEL LYMPH NODE BIOPSY Left 06/27/2018   Procedure: LEFT BREAST LUMPECTOMY WITH RADIOACTIVE SEED AND LEFT AXILLARY SENTINEL LYMPH NODE BIOPSY;  Surgeon: Rolm Bookbinder, MD;  Location: [place];  Service: General;  Laterality: Left;  . BREAST SURGERY     cyst removed  . PARTIAL KNEE ARTHROPLASTY Right 12/08/2015   Procedure: UNICOMPARTMENTAL KNEE;  Surgeon: Vickey Huger, MD;  Location: [place];  Service: Orthopedics;  Laterality: Right;  . TONSILLECTOMY       ALLERGIES:  Allergies  Allergen Reactions  . Darvon [Propoxyphene] Nausea Only and Other (See Comments)    NO APPETITE CAN NOT EAT     CURRENT MEDICATIONS:  Outpatient Encounter Medications as of 10/03/2018  Medication Sig  . acetaminophen (TYLENOL) 500 MG tablet Take 1,000 mg by mouth every 6 (six) hours as needed (for pain.).  Marland Kitchen anastrozole (ARIMIDEX) 1 MG tablet Take 1 tablet (1 mg total) by mouth daily.  . Azelastine HCl 0.15 % SOLN Place 2 sprays into both nostrils at bedtime.  . diphenhydrAMINE (BENADRYL) 25 MG tablet Take 25 mg by mouth every 6 (six) hours as needed.  Marland Kitchen escitalopram (LEXAPRO) 20 MG tablet Take 20 mg by mouth daily.   Marland Kitchen losartan (COZAAR) 100 MG tablet Take 100 mg by mouth  daily.   . meclizine (ANTIVERT) 25 MG tablet Take 25 mg by mouth 3 (three) times daily as needed for dizziness.  . Melatonin 10 MG CAPS Take 10 mg by mouth at bedtime as needed (for sleep.).  Marland Kitchen oxyCODONE (OXY IR/ROXICODONE) 5 MG immediate release tablet Take 1 tablet (5 mg total) by mouth every 6 (six) hours as needed for moderate pain, severe pain or breakthrough pain.  Marland Kitchen SYNTHROID 150 MCG tablet    No facility-administered encounter medications on file as of 10/03/2018.      ONCOLOGIC FAMILY HISTORY:  Family History  Problem Relation Age of Onset  . Lung cancer Father   . Breast cancer Maternal Aunt   . Throat cancer Maternal Uncle   . Pancreatic cancer Maternal Grandmother      GENETIC COUNSELING/TESTING: See above  SOCIAL HISTORY:  Social History   Socioeconomic History  . Marital status: Married    Spouse name: Not on file  . Number of children: Not on file  . Years of education: Not on file  . Highest education level: Not on file  Occupational History  . Not on file  Social Needs  . Financial resource strain: Not on file  . Food insecurity    Worry: Not on file  Inability: Not on file  . Transportation needs    Medical: Not on file    Non-medical: Not on file  Tobacco Use  . Smoking status: Never Smoker  . Smokeless tobacco: Never Used  Substance and Sexual Activity  . Alcohol use: Yes    Alcohol/week: 0.0 standard drinks    Comment: occas.  . Drug use: No  . Sexual activity: Not on file  Lifestyle  . Physical activity    Days per week: Not on file    Minutes per session: Not on file  . Stress: Not on file  Relationships  . Social Herbalist on phone: Not on file    Gets together: Not on file    Attends religious service: Not on file    Active member of club or organization: Not on file    Attends meetings of clubs or organizations: Not on file    Relationship status: Not on file  . Intimate partner violence    Fear of current or ex  partner: Not on file    Emotionally abused: Not on file    Physically abused: Not on file    Forced sexual activity: Not on file  Other Topics Concern  . Not on file  Social History Narrative  . Not on file     OBSERVATIONS/OBJECTIVE:  Patient sounds well.  She is in no apparent distress.  Mood and behavior are normal.  Speech is normal.  Breathing is non labored.    LABORATORY DATA:  None for this visit.  DIAGNOSTIC IMAGING:  None for this visit.      ASSESSMENT AND PLAN:  Ms.. Schmoker is a pleasant 73 y.o. female with Stage IA left breast invasive lobular carcinoma, ER+/PR+/HER2-, diagnosed in 04/2018, treated with lumpectomy, and anti-estrogen therapy with Anastrozole beginning in 04/2018.  She presents to the Survivorship Clinic for our initial meeting and routine follow-up post-completion of treatment for breast cancer.    1. Stage IA left breast cancer:  Ms. Moncivais is continuing to recover from definitive treatment for breast cancer. She will follow-up with her medical oncologist, Dr. Lindi Adie in 12/2018 with history and physical exam per surveillance protocol.  She will continue her anti-estrogen therapy with Anastrozole. Thus far, she is tolerating the Anastrozole well, with minimal side effects. She was instructed to make Dr. Lindi Adie or myself aware if she begins to experience any worsening side effects of the medication and I could see her back in clinic to help manage those side effects, as needed. Kip notes that she had a bilateral mammogram prior to her surgery in April/May.  This isn't scanned into Epic, so we are reaching out to [facility] for more information.   Today, a comprehensive survivorship care plan and treatment summary was reviewed with the patient today detailing her breast cancer diagnosis, treatment course, potential late/long-term effects of treatment, appropriate follow-up care with recommendations for the future, and patient education resources.  A copy of this  summary, along with a letter will be sent to the patient's primary care provider via mail/fax/In Basket message after today's visit.    2. Bone health:  Given Ms. Vokes age/history of breast cancer and her current treatment regimen including anti-estrogen therapy with Anastrozole, she is at risk for bone demineralization.  She cannot recall when her last bone density was tested, however notes Dr. Dema Severin follows this regularly and Estelle says her bones are good.  We will reach out to Dr. Orest Dikes office and get  these results faxed over.  In the meantime, she was encouraged to increase her consumption of foods rich in calcium, as well as increase her weight-bearing activities.  She was given education on specific activities to promote bone health.  3. Cancer screening:  Due to Ms. Sausedo's history and her age, she should receive screening for skin cancers, colon cancer, and gynecologic cancers.  The information and recommendations are listed on the patient's comprehensive care plan/treatment summary and were reviewed in detail with the patient.    4. Health maintenance and wellness promotion: Ms. Morgenthaler was encouraged to consume 5-7 servings of fruits and vegetables per day. We reviewed the "Nutrition Rainbow" handout, as well as the handout "Take Control of Your Health and Reduce Your Cancer Risk" from the [employer].  She was also encouraged to engage in moderate to vigorous exercise for 30 minutes per day most days of the week. We discussed the [employer] fitness program, which is designed for cancer survivors to help them become more physically fit after cancer treatments.  She was instructed to limit her alcohol consumption and continue to abstain from tobacco use.     5. Support services/counseling: It is not uncommon for this period of the patient's cancer care trajectory to be one of many emotions and stressors.  We discussed how this can be increasingly difficult during the times  of quarantine and social distancing due to the COVID-19 pandemic.   She was given information regarding our available services and encouraged to contact me with any questions or for help enrolling in any of our support group/programs.    Follow up instructions:    -Return to cancer center in 12/2018 for f/u with Dr. Lindi Adie  -Mammogram due ?Marland Kitchen  Awaiting results from [facility] -Follow up with surgery 02/2019 -She is welcome to return back to the Survivorship Clinic at any time; no additional follow-up needed at this time.  -Consider referral back to survivorship as a long-term survivor for continued surveillance  The patient was provided an opportunity to ask questions and all were answered. The patient agreed with the plan and demonstrated an understanding of the instructions.   The patient was advised to call back or seek an in-person evaluation if the symptoms worsen or if the condition fails to improve as anticipated.   I provided 18 minutes of non face-to-face telephone visit time during this encounter, and > 50% was spent counseling as documented under my assessment & plan.  Scot Dock, NP   Addendum: Mammogram due in 04/2019 at [facility].  Most recent bone density in 2017 and normal.

## 2018-10-03 NOTE — Telephone Encounter (Signed)
Orders Faxed over to [phone]) for mammogram to be scheduled for March 2021

## 2018-10-04 ENCOUNTER — Telehealth: Payer: Self-pay | Admitting: Hematology and Oncology

## 2018-10-04 NOTE — Telephone Encounter (Signed)
I talk with patient regarding schedule  

## 2019-01-03 NOTE — Progress Notes (Signed)
Patient Care Team: Harlan Stains, MD as PCP - General (Family Medicine) Mauro Kaufmann, RN as Oncology Nurse Navigator Rockwell Germany, RN as Oncology Nurse Navigator Nicholas Lose, MD as Consulting Physician (Hematology and Oncology) Rolm Bookbinder, MD as Consulting Physician (General Surgery) Eppie Gibson, MD as Attending Physician (Radiation Oncology)  DIAGNOSIS:    ICD-10-CM   1. Malignant neoplasm of upper-outer quadrant of left breast in female, estrogen receptor positive ([place])  C50.412    Z17.0     SUMMARY OF ONCOLOGIC HISTORY: Oncology History  Malignant neoplasm of upper-outer quadrant of left breast in female, estrogen receptor positive ([place])  04/17/2018 Initial Diagnosis   Screening detected left breast asymmetry 0.5 cm by mammogram, ultrasound revealed 0.6 cm mass at 3 o'clock position axilla negative, biopsy revealed grade 2 ILC, ER 90%, PR 90%, Ki-67 1%, HER-2 -1+, T1b N0 stage Ia clinical stage   04/26/2018 Cancer Staging   Staging form: Breast, AJCC 8th Edition - Clinical stage from 04/26/2018: Stage IA (cT1b, cN0, cM0, G2, ER+, PR+, HER2-) - Signed by Nicholas Lose, MD on 04/26/2018   04/26/2018 Genetic Testing   Negative.  Genes tested include: ATM, BRCA1, BRCA2, CDH1, CHEK2, PALB2, PTEN, STK11 and TP53.  APC, ATM, AXIN2, BARD1, BMPR1A, BRCA1, BRCA2, BRIP1, CDH1, CDKN2A (p14ARF), CDKN2A (p16INK4a), CKD4, CHEK2, CTNNA1, DICER1, EPCAM (Deletion/duplication testing only), GREM1 (promoter region deletion/duplication testing only), KIT, MEN1, MLH1, MSH2, MSH3, MSH6, MUTYH, NBN, NF1, NHTL1, PALB2, PDGFRA, PMS2, POLD1, POLE, PTEN, RAD50, RAD51C, RAD51D, SDHB, SDHC, SDHD, SMAD4, SMARCA4. STK11, TP53, TSC1, TSC2, and VHL.  The following genes were evaluated for sequence changes only: SDHA and HOXB13 c.251G>A variant only.   05/08/2018 -  Anti-estrogen oral therapy   Anastrozole 1 mg daily   06/27/2018 Surgery   Right lumpectomy Donne Hazel): invasive lobular carcinoma with  DCIS, grade 2, 0.8cm, ER+ (90%), PR+ (90%), HER2- (1+), Ki67 1%, clear margins, 0/5 SNL negative for carcinoma.    07/12/2018 Cancer Staging   Staging form: Breast, AJCC 8th Edition - Pathologic: Stage IA (pT1b, pN0, cM0, G2, ER+, PR+, HER2-) - Signed by Gardenia Phlegm, NP on 07/12/2018     CHIEF COMPLIANT: Follow-up of left breast cancer on anastrozole   INTERVAL HISTORY: Erika Huynh is a 73 y.o. with above-mentioned history of left breast cancer who underwent a lumpectomy and is currently on antiestrogen therapy with anastrozole. She presents to the clinic today for follow-up.   REVIEW OF SYSTEMS:   Constitutional: Denies fevers, chills or abnormal weight loss Eyes: Denies blurriness of vision Ears, nose, mouth, throat, and face: Denies mucositis or sore throat Respiratory: Denies cough, dyspnea or wheezes Cardiovascular: Denies palpitation, chest discomfort Gastrointestinal: Denies nausea, heartburn or change in bowel habits Skin: Denies abnormal skin rashes Lymphatics: Denies new lymphadenopathy or easy bruising Neurological: Denies numbness, tingling or new weaknesses Behavioral/Psych: Mood is stable, no new changes  Extremities: No lower extremity edema Breast: denies any pain or lumps or nodules in either breasts All other systems were reviewed with the patient and are negative.  I have reviewed the past medical history, past surgical history, social history and family history with the patient and they are unchanged from previous note.  ALLERGIES:  is allergic to darvon [propoxyphene].  MEDICATIONS:  Current Outpatient Medications  Medication Sig Dispense Refill   acetaminophen (TYLENOL) 500 MG tablet Take 1,000 mg by mouth every 6 (six) hours as needed (for pain.).     anastrozole (ARIMIDEX) 1 MG tablet Take 1 tablet (1 mg  total) by mouth daily. 90 tablet 3   Azelastine HCl 0.15 % SOLN Place 2 sprays into both nostrils at bedtime. 30 mL 5   diphenhydrAMINE  (BENADRYL) 25 MG tablet Take 25 mg by mouth every 6 (six) hours as needed.     escitalopram (LEXAPRO) 20 MG tablet Take 20 mg by mouth daily.      losartan (COZAAR) 100 MG tablet Take 100 mg by mouth daily.      meclizine (ANTIVERT) 25 MG tablet Take 25 mg by mouth 3 (three) times daily as needed for dizziness.     Melatonin 10 MG CAPS Take 10 mg by mouth at bedtime as needed (for sleep.).     oxyCODONE (OXY IR/ROXICODONE) 5 MG immediate release tablet Take 1 tablet (5 mg total) by mouth every 6 (six) hours as needed for moderate pain, severe pain or breakthrough pain. 12 tablet 0   SYNTHROID 150 MCG tablet      No current facility-administered medications for this visit.     PHYSICAL EXAMINATION: ECOG PERFORMANCE STATUS: 1 - Symptomatic but completely ambulatory  There were no vitals filed for this visit. There were no vitals filed for this visit.  GENERAL: alert, no distress and comfortable SKIN: skin color, texture, turgor are normal, no rashes or significant lesions EYES: normal, Conjunctiva are pink and non-injected, sclera clear OROPHARYNX: no exudate, no erythema and lips, buccal mucosa, and tongue normal  NECK: supple, thyroid normal size, non-tender, without nodularity LYMPH: no palpable lymphadenopathy in the cervical, axillary or inguinal LUNGS: clear to auscultation and percussion with normal breathing effort HEART: regular rate & rhythm and no murmurs and no lower extremity edema ABDOMEN: abdomen soft, non-tender and normal bowel sounds MUSCULOSKELETAL: no cyanosis of digits and no clubbing  NEURO: alert & oriented x 3 with fluent speech, no focal motor/sensory deficits EXTREMITIES: No lower extremity edema  LABORATORY DATA:  I have reviewed the data as listed CMP Latest Ref Rng & Units 04/26/2018 12/09/2015 12/01/2015  Glucose 70 - 99 mg/dL 82 128(H) 85  BUN 8 - 23 mg/dL 16 12 16   Creatinine 0.44 - 1.00 mg/dL 0.83 0.84 0.95  Sodium 135 - 145 mmol/L 141 138 141    Potassium 3.5 - 5.1 mmol/L 4.1 4.3 3.9  Chloride 98 - 111 mmol/L 105 102 107  CO2 22 - 32 mmol/L 27 28 28   Calcium 8.9 - 10.3 mg/dL 8.6(L) 8.4(L) 8.7(L)  Total Protein 6.5 - 8.1 g/dL 6.7 - 6.1(L)  Total Bilirubin 0.3 - 1.2 mg/dL 0.4 - 0.6  Alkaline Phos 38 - 126 U/L 97 - 77  AST 15 - 41 U/L 13(L) - 22  ALT 0 - 44 U/L 13 - 18    Lab Results  Component Value Date   WBC 5.4 04/26/2018   HGB 13.6 04/26/2018   HCT 42.2 04/26/2018   MCV 97.0 04/26/2018   PLT 245 04/26/2018   NEUTROABS 3.0 04/26/2018    ASSESSMENT & PLAN:  Malignant neoplasm of upper-outer quadrant of left breast in female, estrogen receptor positive ([place]) 04/17/2018:Screening detected left breast asymmetry 0.5 cm by mammogram, ultrasound revealed 0.6 cm mass at 3 o'clock position axilla negative, biopsy revealed grade 2 ILC, ER 90%, PR 90%, Ki-67 1%, HER-2 -1+, T1bN0 stage Ia clinical stage Because surgery was delayed due to COVID-19, we started anastrozole 05/08/2018  06/27/2018: Left lumpectomy: 0.8 cm grade 2 IDC with DCIS 0/5 lymph nodes negative, ER 90%, PR 90%, HER-2 -1+, Ki-67 1%, T1BN0 stage Ia  Current  treatment:  Adjuvant antiestrogen therapy with anastrozole started March 2020  Anastrozole Toxicities: None Breast cancer surveillance: 1.  Breast exam 01/04/2019: Benign 2.  Mammogram has been ordered and needs to be done.  Genetics: Neg for mutations. Return to clinic in 1 year for follow-up   No orders of the defined types were placed in this encounter.  The patient has a good understanding of the overall plan. she agrees with it. she will call with any problems that may develop before the next visit here.  Nicholas Lose, MD 01/04/2019  Julious Oka Dorshimer, am acting as scribe for Dr. Nicholas Lose.  I have reviewed the above documentation for accuracy and completeness, and I agree with the above.

## 2019-01-04 ENCOUNTER — Telehealth: Payer: Self-pay | Admitting: Hematology and Oncology

## 2019-01-04 ENCOUNTER — Inpatient Hospital Stay: Payer: Medicare Other | Attending: Hematology and Oncology | Admitting: Hematology and Oncology

## 2019-01-04 ENCOUNTER — Other Ambulatory Visit: Payer: Self-pay

## 2019-01-04 DIAGNOSIS — Z79811 Long term (current) use of aromatase inhibitors: Secondary | ICD-10-CM | POA: Insufficient documentation

## 2019-01-04 DIAGNOSIS — Z170 Estrogen receptor positive status [ER+]: Secondary | ICD-10-CM | POA: Diagnosis not present

## 2019-01-04 DIAGNOSIS — Z79899 Other long term (current) drug therapy: Secondary | ICD-10-CM | POA: Diagnosis not present

## 2019-01-04 DIAGNOSIS — C50412 Malignant neoplasm of upper-outer quadrant of left female breast: Secondary | ICD-10-CM | POA: Diagnosis not present

## 2019-01-04 NOTE — Telephone Encounter (Signed)
I talk with patient regarding schedule  

## 2019-01-04 NOTE — Assessment & Plan Note (Signed)
04/17/2018:Screening detected left breast asymmetry 0.5 cm by mammogram, ultrasound revealed 0.6 cm mass at 3 o'clock position axilla negative, biopsy revealed grade 2 ILC, ER 90%, PR 90%, Ki-67 1%, HER-2 -1+, T1bN0 stage Ia clinical stage Because surgery was delayed due to COVID-19, we started anastrozole 05/08/2018  06/27/2018: Left lumpectomy: 0.8 cm grade 2 IDC with DCIS 0/5 lymph nodes negative, ER 90%, PR 90%, HER-2 -1+, Ki-67 1%, T1BN0 stage Ia  Current treatment:  Adjuvant antiestrogen therapy with anastrozole  Anastrozole Toxicities: None Breast cancer surveillance: 1.  Breast exam 01/04/2019: Benign 2.  Mammogram has been ordered and needs to be done.  Return to clinic in 1 year for follow-up Genetics: Neg for mutations. Return to clinic in 1 year for follow-up

## 2019-01-23 ENCOUNTER — Encounter: Payer: Self-pay | Admitting: *Deleted

## 2019-02-15 ENCOUNTER — Encounter: Payer: Self-pay | Admitting: *Deleted

## 2019-05-04 ENCOUNTER — Other Ambulatory Visit: Payer: Self-pay | Admitting: Hematology and Oncology

## 2019-07-10 ENCOUNTER — Ambulatory Visit (INDEPENDENT_AMBULATORY_CARE_PROVIDER_SITE_OTHER): Payer: Medicare PPO | Admitting: Podiatry

## 2019-07-10 ENCOUNTER — Ambulatory Visit (INDEPENDENT_AMBULATORY_CARE_PROVIDER_SITE_OTHER): Payer: Medicare PPO

## 2019-07-10 ENCOUNTER — Other Ambulatory Visit: Payer: Self-pay

## 2019-07-10 ENCOUNTER — Other Ambulatory Visit: Payer: Self-pay | Admitting: Podiatry

## 2019-07-10 DIAGNOSIS — M84374A Stress fracture, right foot, initial encounter for fracture: Secondary | ICD-10-CM

## 2019-07-10 DIAGNOSIS — M19072 Primary osteoarthritis, left ankle and foot: Secondary | ICD-10-CM

## 2019-07-10 DIAGNOSIS — M79672 Pain in left foot: Secondary | ICD-10-CM

## 2019-07-10 DIAGNOSIS — M79671 Pain in right foot: Secondary | ICD-10-CM

## 2019-07-10 DIAGNOSIS — M19079 Primary osteoarthritis, unspecified ankle and foot: Secondary | ICD-10-CM

## 2019-07-10 NOTE — Progress Notes (Signed)
  Subjective:  Patient ID: Erika Huynh, female    DOB: [birth year],  MRN: [id number]  Chief Complaint  Patient presents with  . Foot Pain    Rt foot pain and swelling x 1wk; 2-3/10 shapr pains -pt dneis injury Tx: none -worse with prolong standing/walking -pt also c/o Lt foot dorsal knot x yeears    74 y.o. female presents with the above complaint. History confirmed with patient.   Objective:  Physical Exam: warm, good capillary refill, no trophic changes or ulcerative lesions, normal DP and PT pulses and normal sensory exam. Left Foot: midfoot tenderness with prominent nodule and osteophytes  Right Foot: POP 2nd metatarsal with pitting edema of the forefoot.   No images are attached to the encounter.  Radiographs: X-ray of both feet: Left foot midfoot DJD, no acute fractures Right foot abnormality along medial border of 2nd and 3rd metatarsal poss resembling exostosis or stress reaction of bone. Assessment:   1. Arthritis of midfoot   2. Stress fracture of metatarsal bone of right foot, initial encounter     Plan:  Patient was evaluated and treated and all questions answered.  ?Stress Fracture vs exostosis right 2nd metatarsal -XR Reviewed -Dispensed surgical shoe -Repeat XR next visit  Midfoot arthritis with possible cyst formation -Educated on etiology -XR reviewed with patient -Injection delivered to the painful joint  Procedure: Joint Injection Location: Left 2nd TMT joint Skin Prep: Alcohol. Injectate: 0.5 cc 1% lidocaine plain, 0.5 cc dexamethasone phosphate. Disposition: Patient tolerated procedure well. Injection site dressed with a band-aid.  Return in about 3 weeks (around 07/31/2019) for Capsulitis, possible stress fx f/u, with XRs.

## 2019-07-13 ENCOUNTER — Telehealth: Payer: Self-pay | Admitting: Podiatry

## 2019-07-13 ENCOUNTER — Other Ambulatory Visit: Payer: Self-pay | Admitting: Podiatry

## 2019-07-13 DIAGNOSIS — M84374A Stress fracture, right foot, initial encounter for fracture: Secondary | ICD-10-CM

## 2019-07-13 DIAGNOSIS — M19079 Primary osteoarthritis, unspecified ankle and foot: Secondary | ICD-10-CM

## 2019-07-13 NOTE — Telephone Encounter (Signed)
Called pt to let her know her copay was posted in [place]. I had to void it and she will receive a bill.

## 2019-07-17 DIAGNOSIS — Z780 Asymptomatic menopausal state: Secondary | ICD-10-CM | POA: Diagnosis not present

## 2019-07-31 ENCOUNTER — Ambulatory Visit (INDEPENDENT_AMBULATORY_CARE_PROVIDER_SITE_OTHER): Payer: Medicare PPO

## 2019-07-31 ENCOUNTER — Ambulatory Visit: Payer: Medicare PPO | Admitting: Podiatry

## 2019-07-31 ENCOUNTER — Other Ambulatory Visit: Payer: Self-pay | Admitting: Podiatry

## 2019-07-31 ENCOUNTER — Other Ambulatory Visit: Payer: Self-pay

## 2019-07-31 DIAGNOSIS — M84374D Stress fracture, right foot, subsequent encounter for fracture with routine healing: Secondary | ICD-10-CM

## 2019-07-31 DIAGNOSIS — M79671 Pain in right foot: Secondary | ICD-10-CM

## 2019-08-03 ENCOUNTER — Other Ambulatory Visit: Payer: Self-pay | Admitting: Hematology and Oncology

## 2019-08-05 NOTE — Progress Notes (Signed)
  Subjective:  Patient ID: Erika Huynh, female    DOB: 1945/08/26,  MRN: [id number]  Chief Complaint  Patient presents with  . Foot Injury    F/U Rt 2nd met fx Pt. states," I have some pain, but it's not bad; 3/10." pt denies swellign tx: sx shoe, elevation and compression anklet    74 y.o. female presents with the above complaint. History confirmed with patient.   Objective:  Physical Exam: warm, good capillary refill, no trophic changes or ulcerative lesions, normal DP and PT pulses and normal sensory exam. Left Foot: midfoot tenderness with prominent nodule and osteophytes  Right Foot: no POP 2nd metatarsal    Repeat XR no worsening cortical irregularity.  Assessment:   1. Stress fracture of metatarsal bone of right foot with routine healing, subsequent encounter     Plan:  Patient was evaluated and treated and all questions answered.  ?Stress Fracture vs exostosis right 2nd metatarsal -Improving. Continue surgical shoe for 1 more week. Return to normal shoegear. If pain persists go back into normal shoegear.   No follow-ups on file.

## 2019-08-13 DIAGNOSIS — L821 Other seborrheic keratosis: Secondary | ICD-10-CM | POA: Diagnosis not present

## 2019-08-13 DIAGNOSIS — F331 Major depressive disorder, recurrent, moderate: Secondary | ICD-10-CM | POA: Diagnosis not present

## 2019-08-15 ENCOUNTER — Other Ambulatory Visit: Payer: Self-pay | Admitting: Podiatry

## 2019-08-15 DIAGNOSIS — M84374D Stress fracture, right foot, subsequent encounter for fracture with routine healing: Secondary | ICD-10-CM

## 2019-12-26 DIAGNOSIS — Z0000 Encounter for general adult medical examination without abnormal findings: Secondary | ICD-10-CM | POA: Diagnosis not present

## 2019-12-26 DIAGNOSIS — J069 Acute upper respiratory infection, unspecified: Secondary | ICD-10-CM | POA: Diagnosis not present

## 2019-12-26 DIAGNOSIS — F331 Major depressive disorder, recurrent, moderate: Secondary | ICD-10-CM | POA: Diagnosis not present

## 2019-12-26 DIAGNOSIS — I10 Essential (primary) hypertension: Secondary | ICD-10-CM | POA: Diagnosis not present

## 2019-12-26 DIAGNOSIS — Z23 Encounter for immunization: Secondary | ICD-10-CM | POA: Diagnosis not present

## 2019-12-26 DIAGNOSIS — E039 Hypothyroidism, unspecified: Secondary | ICD-10-CM | POA: Diagnosis not present

## 2019-12-26 DIAGNOSIS — C50412 Malignant neoplasm of upper-outer quadrant of left female breast: Secondary | ICD-10-CM | POA: Diagnosis not present

## 2020-01-03 NOTE — Progress Notes (Signed)
Patient Care Team: Harlan Stains, MD as PCP - General (Family Medicine) Mauro Kaufmann, RN as Oncology Nurse Navigator Rockwell Germany, RN as Oncology Nurse Navigator Nicholas Lose, MD as Consulting Physician (Hematology and Oncology) Rolm Bookbinder, MD as Consulting Physician (General Surgery) Eppie Gibson, MD as Attending Physician (Radiation Oncology)  DIAGNOSIS:    ICD-10-CM   1. Malignant neoplasm of upper-outer quadrant of left breast in female, estrogen receptor positive ([place])  C50.412    Z17.0     SUMMARY OF ONCOLOGIC HISTORY: Oncology History  Malignant neoplasm of upper-outer quadrant of left breast in female, estrogen receptor positive ([place])  04/17/2018 Initial Diagnosis   Screening detected left breast asymmetry 0.5 cm by mammogram, ultrasound revealed 0.6 cm mass at 3 o'clock position axilla negative, biopsy revealed grade 2 ILC, ER 90%, PR 90%, Ki-67 1%, HER-2 -1+, T1b N0 stage Ia clinical stage   04/26/2018 Cancer Staging   Staging form: Breast, AJCC 8th Edition - Clinical stage from 04/26/2018: Stage IA (cT1b, cN0, cM0, G2, ER+, PR+, HER2-) - Signed by Nicholas Lose, MD on 04/26/2018   04/26/2018 Genetic Testing   Negative.  Genes tested include: ATM, BRCA1, BRCA2, CDH1, CHEK2, PALB2, PTEN, STK11 and TP53.  APC, ATM, AXIN2, BARD1, BMPR1A, BRCA1, BRCA2, BRIP1, CDH1, CDKN2A (p14ARF), CDKN2A (p16INK4a), CKD4, CHEK2, CTNNA1, DICER1, EPCAM (Deletion/duplication testing only), GREM1 (promoter region deletion/duplication testing only), KIT, MEN1, MLH1, MSH2, MSH3, MSH6, MUTYH, NBN, NF1, NHTL1, PALB2, PDGFRA, PMS2, POLD1, POLE, PTEN, RAD50, RAD51C, RAD51D, SDHB, SDHC, SDHD, SMAD4, SMARCA4. STK11, TP53, TSC1, TSC2, and VHL.  The following genes were evaluated for sequence changes only: SDHA and HOXB13 c.251G>A variant only.   05/08/2018 -  Anti-estrogen oral therapy   Anastrozole 1 mg daily   06/27/2018 Surgery   Right lumpectomy Donne Hazel): invasive lobular carcinoma with  DCIS, grade 2, 0.8cm, ER+ (90%), PR+ (90%), HER2- (1+), Ki67 1%, clear margins, 0/5 SNL negative for carcinoma.    07/12/2018 Cancer Staging   Staging form: Breast, AJCC 8th Edition - Pathologic: Stage IA (pT1b, pN0, cM0, G2, ER+, PR+, HER2-) - Signed by Gardenia Phlegm, NP on 07/12/2018     CHIEF COMPLIANT: Follow-up of left breast cancer on anastrozole   INTERVAL HISTORY: Erika Huynh is a 74 y.o. with above-mentioned history of left breast cancer who underwent a lumpectomy and is currently on antiestrogen therapy with anastrozole. Mammogram on 07/04/19 showed no evidence of malignancy bilaterally. She presents to the clinic today for follow-up.   ALLERGIES:  is allergic to darvon [propoxyphene].  MEDICATIONS:  Current Outpatient Medications  Medication Sig Dispense Refill  . acetaminophen (TYLENOL) 500 MG tablet Take 1,000 mg by mouth every 6 (six) hours as needed (for pain.).    Marland Kitchen anastrozole (ARIMIDEX) 1 MG tablet Take 1 tablet (1 mg total) by mouth daily. 90 tablet 3  . Azelastine HCl 0.15 % SOLN Place 2 sprays into both nostrils at bedtime. (Patient not taking: Reported on 07/10/2019) 30 mL 5  . diphenhydrAMINE (BENADRYL) 25 MG tablet Take 25 mg by mouth every 6 (six) hours as needed.    Marland Kitchen escitalopram (LEXAPRO) 20 MG tablet Take 20 mg by mouth daily.     Marland Kitchen losartan (COZAAR) 100 MG tablet Take 100 mg by mouth daily.     . meclizine (ANTIVERT) 25 MG tablet Take 25 mg by mouth 3 (three) times daily as needed for dizziness.    . Melatonin 10 MG CAPS Take 10 mg by mouth at bedtime as needed (for  sleep.).    Marland Kitchen SYNTHROID 175 MCG tablet      No current facility-administered medications for this visit.    PHYSICAL EXAMINATION: ECOG PERFORMANCE STATUS: 1 - Symptomatic but completely ambulatory  Vitals:   01/04/20 0941  BP: 128/72  Pulse: 72  Resp: 18  Temp: 98.6 F (37 C)  SpO2: 97%   Filed Weights   01/04/20 0941  Weight: 215 lb 12.8 oz (97.9 kg)    BREAST: No  palpable masses or nodules in either right or left breasts. No palpable axillary supraclavicular or infraclavicular adenopathy no breast tenderness or nipple discharge. (exam performed in the presence of a chaperone)  LABORATORY DATA:  I have reviewed the data as listed CMP Latest Ref Rng & Units 04/26/2018 12/09/2015 12/01/2015  Glucose 70 - 99 mg/dL 82 128(H) 85  BUN 8 - 23 mg/dL _0 Creatinine 0.44 - 1.00 mg/dL 0.83 0.84 0.95  Sodium 135 - 145 mmol/L 141 138 141  Potassium 3.5 - 5.1 mmol/L 4.1 4.3 3.9  Chloride 98 - 111 mmol/L 105 102 107  CO2 22 - 32 mmol/L _1 Calcium 8.9 - 10.3 mg/dL 8.6(L) 8.4(L) 8.7(L)  Total Protein 6.5 - 8.1 g/dL 6.7 - 6.1(L)  Total Bilirubin 0.3 - 1.2 mg/dL 0.4 - 0.6  Alkaline Phos 38 - 126 U/L 97 - 77  AST 15 - 41 U/L 13(L) - 22  ALT 0 - 44 U/L 13 - 18    Lab Results  Component Value Date   WBC 5.4 04/26/2018   HGB 13.6 04/26/2018   HCT 42.2 04/26/2018   MCV 97.0 04/26/2018   PLT 245 04/26/2018   NEUTROABS 3.0 04/26/2018    ASSESSMENT & PLAN:  Malignant neoplasm of upper-outer quadrant of left breast in female, estrogen receptor positive ([place]) 04/17/2018:Screening detected left breast asymmetry 0.5 cm by mammogram, ultrasound revealed 0.6 cm mass at 3 o'clock position axilla negative, biopsy revealed grade 2 ILC, ER 90%, PR 90%, Ki-67 1%, HER-2 -1+, T1bN0 stage Ia clinical stage Because surgery was delayed due to COVID-19, we started anastrozole 05/08/2018  06/27/2018: Left lumpectomy:0.8 cm grade 2 IDC with DCIS 0/5 lymph nodes negative, ER 90%, PR 90%, HER-2 -1+, Ki-67 1%, T1BN0 stage Ia  Current treatment:Adjuvant antiestrogen therapy with anastrozole started March 2020  Anastrozole Toxicities: None Breast cancer surveillance: 1.  Breast exam 01/04/2020: Benign 2.  Mammogram 06/24/19: Benign.  Genetics: Neg for mutations. Return to clinic in 1 year for follow-up    No orders of the defined types were placed in this  encounter.  The patient has a good understanding of the overall plan. she agrees with it. she will call with any problems that may develop before the next visit here.  Total time spent: 20 mins including face to face time and time spent for planning, charting and coordination of care  Nicholas Lose, MD 01/04/2020  I, Cloyde Reams Dorshimer, am acting as scribe for Dr. Nicholas Lose.  I have reviewed the above documentation for accuracy and completeness, and I agree with the above.

## 2020-01-04 ENCOUNTER — Inpatient Hospital Stay: Payer: Medicare PPO | Attending: Hematology and Oncology | Admitting: Hematology and Oncology

## 2020-01-04 ENCOUNTER — Other Ambulatory Visit: Payer: Self-pay

## 2020-01-04 DIAGNOSIS — Z79811 Long term (current) use of aromatase inhibitors: Secondary | ICD-10-CM | POA: Insufficient documentation

## 2020-01-04 DIAGNOSIS — C50412 Malignant neoplasm of upper-outer quadrant of left female breast: Secondary | ICD-10-CM | POA: Diagnosis not present

## 2020-01-04 DIAGNOSIS — Z79899 Other long term (current) drug therapy: Secondary | ICD-10-CM | POA: Diagnosis not present

## 2020-01-04 DIAGNOSIS — Z170 Estrogen receptor positive status [ER+]: Secondary | ICD-10-CM | POA: Insufficient documentation

## 2020-01-04 MED ORDER — ANASTROZOLE 1 MG PO TABS
1.0000 mg | ORAL_TABLET | Freq: Every day | ORAL | 3 refills | Status: DC
Start: 2020-01-04 — End: 2021-01-30

## 2020-01-04 NOTE — Assessment & Plan Note (Signed)
04/17/2018:Screening detected left breast asymmetry 0.5 cm by mammogram, ultrasound revealed 0.6 cm mass at 3 o'clock position axilla negative, biopsy revealed grade 2 ILC, ER 90%, PR 90%, Ki-67 1%, HER-2 -1+, T1bN0 stage Ia clinical stage Because surgery was delayed due to COVID-19, we started anastrozole 05/08/2018  06/27/2018: Left lumpectomy:0.8 cm grade 2 IDC with DCIS 0/5 lymph nodes negative, ER 90%, PR 90%, HER-2 -1+, Ki-67 1%, T1BN0 stage Ia  Current treatment:Adjuvant antiestrogen therapy with anastrozole started March 2020  Anastrozole Toxicities: None Breast cancer surveillance: 1.  Breast exam 01/04/2020: Benign 2.  Mammogram 06/24/19: Benign.  Genetics: Neg for mutations. Return to clinic in 1 year for follow-up

## 2020-05-14 ENCOUNTER — Encounter: Payer: Self-pay | Admitting: Allergy and Immunology

## 2020-05-14 ENCOUNTER — Other Ambulatory Visit: Payer: Self-pay

## 2020-05-14 ENCOUNTER — Ambulatory Visit: Payer: Medicare PPO | Admitting: Allergy and Immunology

## 2020-05-14 VITALS — BP 140/86 | HR 76 | Resp 16 | Ht 64.0 in | Wt 213.4 lb

## 2020-05-14 DIAGNOSIS — J310 Chronic rhinitis: Secondary | ICD-10-CM | POA: Diagnosis not present

## 2020-05-14 DIAGNOSIS — T485X5A Adverse effect of other anti-common-cold drugs, initial encounter: Secondary | ICD-10-CM

## 2020-05-14 DIAGNOSIS — J3089 Other allergic rhinitis: Secondary | ICD-10-CM | POA: Diagnosis not present

## 2020-05-14 MED ORDER — MONTELUKAST SODIUM 10 MG PO TABS: ORAL_TABLET | ORAL | 5 refills | Status: DC

## 2020-05-14 MED ORDER — METHYLPREDNISOLONE ACETATE 80 MG/ML IJ SUSP
80.0000 mg | Freq: Once | INTRAMUSCULAR | Status: AC
Start: 2020-05-14 — End: 2020-05-14
  Administered 2020-05-14: 80 mg via INTRAMUSCULAR

## 2020-05-14 NOTE — Progress Notes (Signed)
Erika Huynh - Erika Huynh - Erika Huynh   Follow-up Note  Referring Provider: Harlan Stains, MD Primary Provider: Harlan Stains, MD Date of Office Visit: 05/14/2020  Subjective:   Erika Huynh (DOB: 04/08/1945) is a 75 y.o. female who returns to the [facility] on 05/14/2020 in re-evaluation of the following:  HPI: Erika Huynh returns to this clinic in evaluation of nasal problems.  I last saw her in this clinic on 22 August 2017 for an issue with allergic rhinoconjunctivitis and rhinitis medicamentosa.  About 10 days ago she developed nasal congestion and can't smell very well and some sneezing and blowing clear nasal discharge without any fever or ugly nasal discharge but may be some low-grade headache.  She started using an over-the-counter nasal spray of which she cannot remember the name and has been using this agent several times per day since that Huynh in time.  It should be noted that her husband has an upper respiratory tract infection that presented itself over the course of the past day or 2.  She has obtained 3 Pfizer COVID vaccines and a flu vaccine.  Allergies as of 05/14/2020      Reactions   Propoxyphene Nausea Only, Other (See Comments)   NO APPETITE CAN NOT EAT Other reaction(s): vomiting      Medication List      acetaminophen 500 MG tablet Commonly known as: TYLENOL Take 1,000 mg by mouth every 6 (six) hours as needed (for pain.).   anastrozole 1 MG tablet Commonly known as: ARIMIDEX Take 1 tablet (1 mg total) by mouth daily.   diphenhydrAMINE 25 MG tablet Commonly known as: BENADRYL Take 25 mg by mouth every 6 (six) hours as needed.   escitalopram 20 MG tablet Commonly known as: LEXAPRO Take 20 mg by mouth daily.   losartan 100 MG tablet Commonly known as: COZAAR Take 100 mg by mouth daily.   meclizine 25 MG tablet Commonly known as: ANTIVERT Take 25 mg by mouth 3 (three) times daily as needed for dizziness.    Melatonin 10 MG Caps Take 10 mg by mouth at bedtime as needed (for sleep.).   Synthroid 175 MCG tablet Generic drug: levothyroxine       Past Medical History:  Diagnosis Date  . Anxiety   . Arthritis   . Breast cancer ([place])   . Depression   . Family history of breast cancer   . Family history of lung cancer   . Family history of pancreatic cancer   . Family history of throat cancer   . Headache   . Hypertension   . Hypothyroidism     Past Surgical History:  Procedure Laterality Date  . ABDOMINAL HYSTERECTOMY    . ADENOIDECTOMY    . BREAST LUMPECTOMY WITH RADIOACTIVE SEED AND SENTINEL LYMPH NODE BIOPSY Left 06/27/2018   Procedure: LEFT BREAST LUMPECTOMY WITH RADIOACTIVE SEED AND LEFT AXILLARY SENTINEL LYMPH NODE BIOPSY;  Surgeon: Rolm Bookbinder, MD;  Location: [facility];  Service: General;  Laterality: Left;  . BREAST SURGERY     cyst removed  . PARTIAL KNEE ARTHROPLASTY Right 12/08/2015   Procedure: UNICOMPARTMENTAL KNEE;  Surgeon: Vickey Huger, MD;  Location: [facility];  Service: Orthopedics;  Laterality: Right;  . TONSILLECTOMY      Review of systems negative except as noted in HPI / PMHx or noted below:  Review of Systems  Constitutional: Negative.   HENT: Negative.   Eyes: Negative.   Respiratory: Negative.  Cardiovascular: Negative.   Gastrointestinal: Negative.   Genitourinary: Negative.   Musculoskeletal: Negative.   Skin: Negative.   Neurological: Negative.   Endo/Heme/Allergies: Negative.   Psychiatric/Behavioral: Negative.      Objective:   Vitals:   05/14/20 1426  BP: 140/86  Pulse: 76  Resp: 16  SpO2: 94%   Height: 5\' 4"  (162.6 cm)  Weight: 213 lb 6.4 oz (96.8 kg)   Physical Exam Constitutional:      Appearance: She is not diaphoretic.  HENT:     Head: Normocephalic.     Right Ear: Tympanic membrane, ear canal and external ear normal.     Left Ear: Tympanic membrane, ear canal and external ear normal.     Nose:  Nose normal. No mucosal edema (Massive mucosal swelling with obliteration of left nasal airway) or rhinorrhea.     Mouth/Throat:     Pharynx: Uvula midline. No oropharyngeal exudate.  Eyes:     Conjunctiva/sclera: Conjunctivae normal.  Neck:     Thyroid: No thyromegaly.     Trachea: Trachea normal. No tracheal tenderness or tracheal deviation.  Cardiovascular:     Rate and Rhythm: Normal rate and regular rhythm.     Heart sounds: Normal heart sounds, S1 normal and S2 normal. No murmur heard.   Pulmonary:     Effort: No respiratory distress.     Breath sounds: Normal breath sounds. No stridor. No wheezing or rales.  Lymphadenopathy:     Head:     Right side of head: No tonsillar adenopathy.     Left side of head: No tonsillar adenopathy.     Cervical: No cervical adenopathy.  Skin:    Findings: No erythema or rash.     Nails: There is no clubbing.  Neurological:     Mental Status: She is alert.     Diagnostics: none  Assessment and Plan:   1. Perennial allergic rhinitis   2. Rhinitis medicamentosa     1.  Depo-Medrol 80 IM delivered in clinic today  2.  Flonase -1 spray each nostril twice a day  3.  Montelukast 10 mg -1 tablet 1 time per day  4.  OTC loratadine 10 mg -2 tablets 1 time per day  5.  Can add nasal saline if needed  6.  Contact clinic next week with update.  Further evaluation and treatment???  Erika Huynh appears to have some form of inflammation affecting her upper airway and whether this is infectious in nature or an allergic in nature is not entirely clear.  She has complicated this issue by using an over-the-counter nasal decongestant spray.  We will have her utilize the medical therapy noted above and she will contact me next week with an update we will make a decision about further evaluation and treatment based upon her response.  Erika Katz, MD Allergy / Immunology [place]

## 2020-05-14 NOTE — Patient Instructions (Addendum)
  1.  Depo-Medrol 80 IM delivered in clinic today  2.  Flonase -1 spray each nostril twice a day  3.  Montelukast 10 mg -1 tablet 1 time per day  4.  OTC loratadine 10 mg -2 tablets 1 time per day  5.  Can add nasal saline if needed  6.  Contact clinic next week with update.  Further evaluation and treatment???

## 2020-05-15 ENCOUNTER — Encounter: Payer: Self-pay | Admitting: Allergy and Immunology

## 2020-05-22 ENCOUNTER — Other Ambulatory Visit: Payer: Self-pay

## 2020-05-22 ENCOUNTER — Telehealth: Payer: Self-pay | Admitting: Allergy and Immunology

## 2020-05-22 MED ORDER — AZELASTINE HCL 0.1 % NA SOLN: NASAL | 5 refills | Status: DC

## 2020-05-22 NOTE — Telephone Encounter (Signed)
Please inform Erika Huynh that she is very allergic to grass and that is probably the issue is giving rise to this problem.  She can add in azelastine nose spray-1-2 sprays each nostril 1-2 times per day

## 2020-05-22 NOTE — Telephone Encounter (Signed)
Please advise 

## 2020-05-22 NOTE — Telephone Encounter (Signed)
Patient called and said that she came in last week and she still having nasal congestion.[phone].

## 2020-05-22 NOTE — Telephone Encounter (Signed)
Azelastine prescription sent to patient's pharmacy. Patient was informed and verbalized understanding

## 2020-07-15 ENCOUNTER — Other Ambulatory Visit: Payer: Self-pay

## 2020-07-15 DIAGNOSIS — C50412 Malignant neoplasm of upper-outer quadrant of left female breast: Secondary | ICD-10-CM

## 2020-07-16 DIAGNOSIS — Z853 Personal history of malignant neoplasm of breast: Secondary | ICD-10-CM | POA: Diagnosis not present

## 2020-07-24 ENCOUNTER — Encounter: Payer: Self-pay | Admitting: Hematology and Oncology

## 2020-08-22 DIAGNOSIS — I10 Essential (primary) hypertension: Secondary | ICD-10-CM | POA: Diagnosis not present

## 2020-08-22 DIAGNOSIS — C50412 Malignant neoplasm of upper-outer quadrant of left female breast: Secondary | ICD-10-CM | POA: Diagnosis not present

## 2020-08-22 DIAGNOSIS — F331 Major depressive disorder, recurrent, moderate: Secondary | ICD-10-CM | POA: Diagnosis not present

## 2020-08-22 DIAGNOSIS — E039 Hypothyroidism, unspecified: Secondary | ICD-10-CM | POA: Diagnosis not present

## 2020-08-22 DIAGNOSIS — L989 Disorder of the skin and subcutaneous tissue, unspecified: Secondary | ICD-10-CM | POA: Diagnosis not present

## 2021-01-04 NOTE — Progress Notes (Incomplete)
Patient Care Team: Harlan Stains, MD as PCP - General (Family Medicine) Mauro Kaufmann, RN as Oncology Nurse Navigator Rockwell Germany, RN as Oncology Nurse Navigator Nicholas Lose, MD as Consulting Physician (Hematology and Oncology) Rolm Bookbinder, MD as Consulting Physician (General Surgery) Eppie Gibson, MD as Attending Physician (Radiation Oncology)  DIAGNOSIS: No diagnosis found.  SUMMARY OF ONCOLOGIC HISTORY: Oncology History  Malignant neoplasm of upper-outer quadrant of left breast in female, estrogen receptor positive ([place])  04/17/2018 Initial Diagnosis   Screening detected left breast asymmetry 0.5 cm by mammogram, ultrasound revealed 0.6 cm mass at 3 o'clock position axilla negative, biopsy revealed grade 2 ILC, ER 90%, PR 90%, Ki-67 1%, HER-2 -1+, T1b N0 stage Ia clinical stage   04/26/2018 Cancer Staging   Staging form: Breast, AJCC 8th Edition - Clinical stage from 04/26/2018: Stage IA (cT1b, cN0, cM0, G2, ER+, PR+, HER2-) - Signed by Nicholas Lose, MD on 04/26/2018    04/26/2018 Genetic Testing   Negative.  Genes tested include: ATM, BRCA1, BRCA2, CDH1, CHEK2, PALB2, PTEN, STK11 and TP53.  APC, ATM, AXIN2, BARD1, BMPR1A, BRCA1, BRCA2, BRIP1, CDH1, CDKN2A (p14ARF), CDKN2A (p16INK4a), CKD4, CHEK2, CTNNA1, DICER1, EPCAM (Deletion/duplication testing only), GREM1 (promoter region deletion/duplication testing only), KIT, MEN1, MLH1, MSH2, MSH3, MSH6, MUTYH, NBN, NF1, NHTL1, PALB2, PDGFRA, PMS2, POLD1, POLE, PTEN, RAD50, RAD51C, RAD51D, SDHB, SDHC, SDHD, SMAD4, SMARCA4. STK11, TP53, TSC1, TSC2, and VHL.  The following genes were evaluated for sequence changes only: SDHA and HOXB13 c.251G>A variant only.   05/08/2018 -  Anti-estrogen oral therapy   Anastrozole 1 mg daily   06/27/2018 Surgery   Right lumpectomy Donne Hazel): invasive lobular carcinoma with DCIS, grade 2, 0.8cm, ER+ (90%), PR+ (90%), HER2- (1+), Ki67 1%, clear margins, 0/5 SNL negative for carcinoma.     07/12/2018 Cancer Staging   Staging form: Breast, AJCC 8th Edition - Pathologic: Stage IA (pT1b, pN0, cM0, G2, ER+, PR+, HER2-) - Signed by Gardenia Phlegm, NP on 07/12/2018      CHIEF COMPLIANT: Follow-up of left breast cancer on anastrozole   INTERVAL HISTORY: Erika Huynh is a 75 y.o. with above-mentioned history of  left breast cancer who underwent a lumpectomy and is currently on antiestrogen therapy with anastrozole. Mammogram on 07/16/2020 showed no evidence of malignancy bilaterally. She presents to the clinic today for follow-up.   ALLERGIES:  is allergic to propoxyphene.  MEDICATIONS:  Current Outpatient Medications  Medication Sig Dispense Refill   acetaminophen (TYLENOL) 500 MG tablet Take 1,000 mg by mouth every 6 (six) hours as needed (for pain.).     anastrozole (ARIMIDEX) 1 MG tablet Take 1 tablet (1 mg total) by mouth daily. 90 tablet 3   azelastine (ASTELIN) 0.1 % nasal spray 1-2 sprays in each nostril 1-2 times per day 30 mL 5   diphenhydrAMINE (BENADRYL) 25 MG tablet Take 25 mg by mouth every 6 (six) hours as needed.     escitalopram (LEXAPRO) 20 MG tablet Take 20 mg by mouth daily.      losartan (COZAAR) 100 MG tablet Take 100 mg by mouth daily.      meclizine (ANTIVERT) 25 MG tablet Take 25 mg by mouth 3 (three) times daily as needed for dizziness.     Melatonin 10 MG CAPS Take 10 mg by mouth at bedtime as needed (for sleep.).     montelukast (SINGULAIR) 10 MG tablet 1 tablet 1 time per day daily 30 tablet 5   SYNTHROID 175 MCG tablet  No current facility-administered medications for this visit.    PHYSICAL EXAMINATION: ECOG PERFORMANCE STATUS: {CHL ONC ECOG PS:[id number]}  There were no vitals filed for this visit. There were no vitals filed for this visit.  BREAST:*** No palpable masses or nodules in either right or left breasts. No palpable axillary supraclavicular or infraclavicular adenopathy no breast tenderness or nipple discharge.  (exam performed in the presence of a chaperone)  LABORATORY DATA:  I have reviewed the data as listed CMP Latest Ref Rng & Units 04/26/2018 12/09/2015 12/01/2015  Glucose 70 - 99 mg/dL 82 128(H) 85  BUN 8 - 23 mg/dL 16 12 16   Creatinine 0.44 - 1.00 mg/dL 0.83 0.84 0.95  Sodium 135 - 145 mmol/L 141 138 141  Potassium 3.5 - 5.1 mmol/L 4.1 4.3 3.9  Chloride 98 - 111 mmol/L 105 102 107  CO2 22 - 32 mmol/L 27 28 28   Calcium 8.9 - 10.3 mg/dL 8.6(L) 8.4(L) 8.7(L)  Total Protein 6.5 - 8.1 g/dL 6.7 - 6.1(L)  Total Bilirubin 0.3 - 1.2 mg/dL 0.4 - 0.6  Alkaline Phos 38 - 126 U/L 97 - 77  AST 15 - 41 U/L 13(L) - 22  ALT 0 - 44 U/L 13 - 18    Lab Results  Component Value Date   WBC 5.4 04/26/2018   HGB 13.6 04/26/2018   HCT 42.2 04/26/2018   MCV 97.0 04/26/2018   PLT 245 04/26/2018   NEUTROABS 3.0 04/26/2018    ASSESSMENT & PLAN:  No problem-specific Assessment & Plan notes found for this encounter.    No orders of the defined types were placed in this encounter.  The patient has a good understanding of the overall plan. she agrees with it. she will call with any problems that may develop before the next visit here.  Total time spent: *** mins including face to face time and time spent for planning, charting and coordination of care  Rulon Eisenmenger, MD, MPH 01/04/2021  I, Thana Ates, am acting as scribe for Dr. Nicholas Lose.  {insert scribe attestation}

## 2021-01-05 ENCOUNTER — Ambulatory Visit: Payer: Medicare PPO | Admitting: Hematology and Oncology

## 2021-01-05 DIAGNOSIS — Z0000 Encounter for general adult medical examination without abnormal findings: Secondary | ICD-10-CM | POA: Diagnosis not present

## 2021-01-05 DIAGNOSIS — I10 Essential (primary) hypertension: Secondary | ICD-10-CM | POA: Diagnosis not present

## 2021-01-05 DIAGNOSIS — C50412 Malignant neoplasm of upper-outer quadrant of left female breast: Secondary | ICD-10-CM | POA: Diagnosis not present

## 2021-01-05 DIAGNOSIS — F325 Major depressive disorder, single episode, in full remission: Secondary | ICD-10-CM | POA: Diagnosis not present

## 2021-01-05 DIAGNOSIS — M1711 Unilateral primary osteoarthritis, right knee: Secondary | ICD-10-CM | POA: Diagnosis not present

## 2021-01-05 DIAGNOSIS — E039 Hypothyroidism, unspecified: Secondary | ICD-10-CM | POA: Diagnosis not present

## 2021-01-05 NOTE — Assessment & Plan Note (Deleted)
04/17/2018:Screening detected left breast asymmetry 0.5 cm by mammogram, ultrasound revealed 0.6 cm mass at 3 o'clock position axilla negative, biopsy revealed grade 2 ILC, ER 90%, PR 90%, Ki-67 1%, HER-2 -1+, T1bN0 stage Ia clinical stage Because surgery was delayed due to COVID-19, we started anastrozole 05/08/2018  06/27/2018: Left lumpectomy:0.8 cm grade 2 IDC with DCIS 0/5 lymph nodes negative, ER 90%, PR 90%, HER-2 -1+, Ki-67 1%, T1BN0 stage Ia  Current treatment:Adjuvant antiestrogen therapy with anastrozolestarted March 2020  Anastrozole Toxicities: None Breast cancer surveillance: 1.Breast exam 01/05/2021: Benign 2.Mammogram  07/16/2020: Benign.  Genetics: Neg for mutations. Return to clinic in1 year for follow-up

## 2021-01-29 DIAGNOSIS — E039 Hypothyroidism, unspecified: Secondary | ICD-10-CM | POA: Diagnosis not present

## 2021-01-29 DIAGNOSIS — I10 Essential (primary) hypertension: Secondary | ICD-10-CM | POA: Diagnosis not present

## 2021-01-30 ENCOUNTER — Other Ambulatory Visit: Payer: Self-pay | Admitting: Hematology and Oncology

## 2021-02-02 ENCOUNTER — Telehealth: Payer: Self-pay | Admitting: Hematology and Oncology

## 2021-02-02 NOTE — Telephone Encounter (Signed)
Scheduled appointment per 12/16 staff message. Left message.

## 2021-02-04 NOTE — Progress Notes (Incomplete)
Patient Care Team: Harlan Stains, MD as PCP - General (Family Medicine) Mauro Kaufmann, RN as Oncology Nurse Navigator Rockwell Germany, RN as Oncology Nurse Navigator Nicholas Lose, MD as Consulting Physician (Hematology and Oncology) Rolm Bookbinder, MD as Consulting Physician (General Surgery) Eppie Gibson, MD as Attending Physician (Radiation Oncology)  DIAGNOSIS: No diagnosis found.  SUMMARY OF ONCOLOGIC HISTORY: Oncology History  Malignant neoplasm of upper-outer quadrant of left breast in female, estrogen receptor positive ([place])  04/17/2018 Initial Diagnosis   Screening detected left breast asymmetry 0.5 cm by mammogram, ultrasound revealed 0.6 cm mass at 3 o'clock position axilla negative, biopsy revealed grade 2 ILC, ER 90%, PR 90%, Ki-67 1%, HER-2 -1+, T1b N0 stage Ia clinical stage   04/26/2018 Cancer Staging   Staging form: Breast, AJCC 8th Edition - Clinical stage from 04/26/2018: Stage IA (cT1b, cN0, cM0, G2, ER+, PR+, HER2-) - Signed by Nicholas Lose, MD on 04/26/2018    04/26/2018 Genetic Testing   Negative.  Genes tested include: ATM, BRCA1, BRCA2, CDH1, CHEK2, PALB2, PTEN, STK11 and TP53.  APC, ATM, AXIN2, BARD1, BMPR1A, BRCA1, BRCA2, BRIP1, CDH1, CDKN2A (p14ARF), CDKN2A (p16INK4a), CKD4, CHEK2, CTNNA1, DICER1, EPCAM (Deletion/duplication testing only), GREM1 (promoter region deletion/duplication testing only), KIT, MEN1, MLH1, MSH2, MSH3, MSH6, MUTYH, NBN, NF1, NHTL1, PALB2, PDGFRA, PMS2, POLD1, POLE, PTEN, RAD50, RAD51C, RAD51D, SDHB, SDHC, SDHD, SMAD4, SMARCA4. STK11, TP53, TSC1, TSC2, and VHL.  The following genes were evaluated for sequence changes only: SDHA and HOXB13 c.251G>A variant only.   05/08/2018 -  Anti-estrogen oral therapy   Anastrozole 1 mg daily   06/27/2018 Surgery   Right lumpectomy Donne Hazel): invasive lobular carcinoma with DCIS, grade 2, 0.8cm, ER+ (90%), PR+ (90%), HER2- (1+), Ki67 1%, clear margins, 0/5 SNL negative for carcinoma.     07/12/2018 Cancer Staging   Staging form: Breast, AJCC 8th Edition - Pathologic: Stage IA (pT1b, pN0, cM0, G2, ER+, PR+, HER2-) - Signed by Gardenia Phlegm, NP on 07/12/2018      CHIEF COMPLIANT: Follow-up of left breast cancer on anastrozole   INTERVAL HISTORY: Erika Huynh is a 75 y.o. with above-mentioned history of left breast cancer who underwent a lumpectomy and is currently on antiestrogen therapy with anastrozole. Mammogram on 07/16/2020 showed no evidence of malignancy bilaterally. She presents to the clinic today for follow-up.   ALLERGIES:  is allergic to propoxyphene.  MEDICATIONS:  Current Outpatient Medications  Medication Sig Dispense Refill   acetaminophen (TYLENOL) 500 MG tablet Take 1,000 mg by mouth every 6 (six) hours as needed (for pain.).     anastrozole (ARIMIDEX) 1 MG tablet TTAKE 1 TABLET BY MOUTH DAILY 90 tablet 0   azelastine (ASTELIN) 0.1 % nasal spray 1-2 sprays in each nostril 1-2 times per day 30 mL 5   diphenhydrAMINE (BENADRYL) 25 MG tablet Take 25 mg by mouth every 6 (six) hours as needed.     escitalopram (LEXAPRO) 20 MG tablet Take 20 mg by mouth daily.      losartan (COZAAR) 100 MG tablet Take 100 mg by mouth daily.      meclizine (ANTIVERT) 25 MG tablet Take 25 mg by mouth 3 (three) times daily as needed for dizziness.     Melatonin 10 MG CAPS Take 10 mg by mouth at bedtime as needed (for sleep.).     montelukast (SINGULAIR) 10 MG tablet 1 tablet 1 time per day daily 30 tablet 5   SYNTHROID 175 MCG tablet      No current  facility-administered medications for this visit.    PHYSICAL EXAMINATION: ECOG PERFORMANCE STATUS: {CHL ONC ECOG PS:[phone]}  There were no vitals filed for this visit. There were no vitals filed for this visit.  BREAST:*** No palpable masses or nodules in either right or left breasts. No palpable axillary supraclavicular or infraclavicular adenopathy no breast tenderness or nipple discharge. (exam performed in  the presence of a chaperone)  LABORATORY DATA:  I have reviewed the data as listed CMP Latest Ref Rng & Units 04/26/2018 12/09/2015 12/01/2015  Glucose 70 - 99 mg/dL 82 128(H) 85  BUN 8 - 23 mg/dL _0 Creatinine 0.44 - 1.00 mg/dL 0.83 0.84 0.95  Sodium 135 - 145 mmol/L 141 138 141  Potassium 3.5 - 5.1 mmol/L 4.1 4.3 3.9  Chloride 98 - 111 mmol/L 105 102 107  CO2 22 - 32 mmol/L _1 Calcium 8.9 - 10.3 mg/dL 8.6(L) 8.4(L) 8.7(L)  Total Protein 6.5 - 8.1 g/dL 6.7 - 6.1(L)  Total Bilirubin 0.3 - 1.2 mg/dL 0.4 - 0.6  Alkaline Phos 38 - 126 U/L 97 - 77  AST 15 - 41 U/L 13(L) - 22  ALT 0 - 44 U/L 13 - 18    Lab Results  Component Value Date   WBC 5.4 04/26/2018   HGB 13.6 04/26/2018   HCT 42.2 04/26/2018   MCV 97.0 04/26/2018   PLT 245 04/26/2018   NEUTROABS 3.0 04/26/2018    ASSESSMENT & PLAN:  No problem-specific Assessment & Plan notes found for this encounter.    No orders of the defined types were placed in this encounter.  The patient has a good understanding of the overall plan. she agrees with it. she will call with any problems that may develop before the next visit here.  Total time spent: *** mins including face to face time and time spent for planning, charting and coordination of care  Rulon Eisenmenger, MD, MPH 02/04/2021  I, Thana Ates, am acting as scribe for Dr. Nicholas Lose.  {insert scribe attestation}

## 2021-02-05 NOTE — Assessment & Plan Note (Deleted)
04/17/2018:Screening detected left breast asymmetry 0.5 cm by mammogram, ultrasound revealed 0.6 cm mass at 3 o'clock position axilla negative, biopsy revealed grade 2 ILC, ER 90%, PR 90%, Ki-67 1%, HER-2 -1+, T1bN0 stage Ia clinical stage Because surgery was delayed due to COVID-19, we started anastrozole 05/08/2018  06/27/2018: Left lumpectomy:0.8 cm grade 2 IDC with DCIS 0/5 lymph nodes negative, ER 90%, PR 90%, HER-2 -1+, Ki-67 1%, T1BN0 stage Ia  Current treatment:Adjuvant antiestrogen therapy with anastrozolestarted March 2020  Anastrozole Toxicities: None Breast cancer surveillance: 1.Breast exam 02/06/21: Benign 2.Mammogram 07/16/20: Benign.  Genetics: Neg for mutations. Return to clinic in1 year for follow-up

## 2021-02-06 ENCOUNTER — Inpatient Hospital Stay: Payer: Medicare PPO | Attending: Hematology and Oncology | Admitting: Hematology and Oncology

## 2021-04-30 ENCOUNTER — Telehealth: Payer: Self-pay | Admitting: Hematology and Oncology

## 2021-04-30 NOTE — Telephone Encounter (Signed)
.  Called patient to schedule appointment per 3/14 inbasket, patient is aware of date and time.   ?

## 2021-05-01 ENCOUNTER — Other Ambulatory Visit: Payer: Self-pay | Admitting: *Deleted

## 2021-05-01 MED ORDER — ANASTROZOLE 1 MG PO TABS: ORAL_TABLET | ORAL | 0 refills | Status: DC

## 2021-05-20 ENCOUNTER — Other Ambulatory Visit: Payer: Self-pay

## 2021-05-20 ENCOUNTER — Inpatient Hospital Stay: Payer: Medicare PPO | Attending: Hematology and Oncology | Admitting: Hematology and Oncology

## 2021-05-20 DIAGNOSIS — Z170 Estrogen receptor positive status [ER+]: Secondary | ICD-10-CM

## 2021-05-20 DIAGNOSIS — Z79811 Long term (current) use of aromatase inhibitors: Secondary | ICD-10-CM | POA: Insufficient documentation

## 2021-05-20 DIAGNOSIS — C50412 Malignant neoplasm of upper-outer quadrant of left female breast: Secondary | ICD-10-CM | POA: Diagnosis not present

## 2021-05-20 MED ORDER — ANASTROZOLE 1 MG PO TABS: ORAL_TABLET | ORAL | 4 refills | Status: DC

## 2021-05-20 NOTE — Progress Notes (Signed)
? ?Patient Care Team: ?Harlan Stains, MD as PCP - General (Family Medicine) ?Mauro Kaufmann, RN as Oncology Nurse Navigator ?Rockwell Germany, RN as Oncology Nurse Navigator ?Nicholas Lose, MD as Consulting Physician (Hematology and Oncology) ?Rolm Bookbinder, MD as Consulting Physician (General Surgery) ?Eppie Gibson, MD as Attending Physician (Radiation Oncology) ? ?DIAGNOSIS:  ?Encounter Diagnosis  ?Name Primary?  ? Malignant neoplasm of upper-outer quadrant of left breast in female, estrogen receptor positive ([place])   ? ? ?SUMMARY OF ONCOLOGIC HISTORY: ?Oncology History  ?Malignant neoplasm of upper-outer quadrant of left breast in female, estrogen receptor positive ([place])  ?04/17/2018 Initial Diagnosis  ? Screening detected left breast asymmetry 0.5 cm by mammogram, ultrasound revealed 0.6 cm mass at 3 o'clock position axilla negative, biopsy revealed grade 2 ILC, ER 90%, PR 90%, Ki-67 1%, HER-2 -1+, T1b N0 stage Ia clinical stage ?  ?04/26/2018 Cancer Staging  ? Staging form: Breast, AJCC 8th Edition ?- Clinical stage from 04/26/2018: Stage IA (cT1b, cN0, cM0, G2, ER+, PR+, HER2-) - Signed by Nicholas Lose, MD on 04/26/2018 ? ?  ?04/26/2018 Genetic Testing  ? Negative.  Genes tested include: ATM, BRCA1, BRCA2, CDH1, CHEK2, PALB2, PTEN, STK11 and TP53.  APC, ATM, AXIN2, BARD1, BMPR1A, BRCA1, BRCA2, BRIP1, CDH1, CDKN2A (p14ARF), CDKN2A (p16INK4a), CKD4, CHEK2, CTNNA1, DICER1, EPCAM (Deletion/duplication testing only), GREM1 (promoter region deletion/duplication testing only), KIT, MEN1, MLH1, MSH2, MSH3, MSH6, MUTYH, NBN, NF1, NHTL1, PALB2, PDGFRA, PMS2, POLD1, POLE, PTEN, RAD50, RAD51C, RAD51D, SDHB, SDHC, SDHD, SMAD4, SMARCA4. STK11, TP53, TSC1, TSC2, and VHL.  The following genes were evaluated for sequence changes only: SDHA and HOXB13 c.251G>A variant only. ?  ?05/08/2018 -  Anti-estrogen oral therapy  ? Anastrozole 1 mg daily ?  ?06/27/2018 Surgery  ? Right lumpectomy Donne Hazel): invasive lobular carcinoma  with DCIS, grade 2, 0.8cm, ER+ (90%), PR+ (90%), HER2- (1+), Ki67 1%, clear margins, 0/5 SNL negative for carcinoma.  ?  ?07/12/2018 Cancer Staging  ? Staging form: Breast, AJCC 8th Edition ?- Pathologic: Stage IA (pT1b, pN0, cM0, G2, ER+, PR+, HER2-) - Signed by Gardenia Phlegm, NP on 07/12/2018 ? ?  ? ? ?CHIEF COMPLIANT: Follow-up of left breast cancer on anastrozole  ? ?INTERVAL HISTORY: Erika Huynh is a  76 y.o. with above-mentioned history of left breast cancer who underwent a lumpectomy and is currently on antiestrogen therapy with anastrozole. She presents to the clinic today for follow-up. She is tolerating the anastrozole. Denies pain and joint aches. ? ?ALLERGIES:  is allergic to propoxyphene. ? ?MEDICATIONS:  ?Current Outpatient Medications  ?Medication Sig Dispense Refill  ? acetaminophen (TYLENOL) 500 MG tablet Take 1,000 mg by mouth every 6 (six) hours as needed (for pain.).    ? anastrozole (ARIMIDEX) 1 MG tablet TTAKE 1 TABLET BY MOUTH DAILY 90 tablet 4  ? azelastine (ASTELIN) 0.1 % nasal spray 1-2 sprays in each nostril 1-2 times per day 30 mL 5  ? diphenhydrAMINE (BENADRYL) 25 MG tablet Take 25 mg by mouth every 6 (six) hours as needed.    ? escitalopram (LEXAPRO) 20 MG tablet Take 20 mg by mouth daily.     ? losartan (COZAAR) 100 MG tablet Take 100 mg by mouth daily.     ? meclizine (ANTIVERT) 25 MG tablet Take 25 mg by mouth 3 (three) times daily as needed for dizziness.    ? Melatonin 10 MG CAPS Take 10 mg by mouth at bedtime as needed (for sleep.).    ? montelukast (SINGULAIR) 10 MG tablet  1 tablet 1 time per day daily 30 tablet 5  ? SYNTHROID 175 MCG tablet     ? ?No current facility-administered medications for this visit.  ? ? ?PHYSICAL EXAMINATION: ?ECOG PERFORMANCE STATUS: 1 - Symptomatic but completely ambulatory ? ?Vitals:  ? 05/20/21 1047  ?BP: 138/64  ?Pulse: 62  ?Resp: 18  ?Temp: (!) 97.4 ?F (36.3 ?C)  ?SpO2: 100%  ? ?Filed Weights  ? 05/20/21 1047  ?Weight: 211 lb 12.8 oz  (96.1 kg)  ? ? ?BREAST: No palpable masses or nodules in either right or left breasts. No palpable axillary supraclavicular or infraclavicular adenopathy no breast tenderness or nipple discharge. (exam performed in the presence of a chaperone) ? ?LABORATORY DATA:  ?I have reviewed the data as listed ? ?  Latest Ref Rng & Units 04/26/2018  ?  8:07 AM 12/09/2015  ?  6:59 AM 12/01/2015  ? 10:53 AM  ?CMP  ?Glucose 70 - 99 mg/dL 82   128   85    ?BUN 8 - 23 mg/dL _0 ?Creatinine 0.44 - 1.00 mg/dL 0.83   0.84   0.95    ?Sodium 135 - 145 mmol/L 141   138   141    ?Potassium 3.5 - 5.1 mmol/L 4.1   4.3   3.9    ?Chloride 98 - 111 mmol/L 105   102   107    ?CO2 22 - 32 mmol/L _1 ?Calcium 8.9 - 10.3 mg/dL 8.6   8.4   8.7    ?Total Protein 6.5 - 8.1 g/dL 6.7    6.1    ?Total Bilirubin 0.3 - 1.2 mg/dL 0.4    0.6    ?Alkaline Phos 38 - 126 U/L 97    77    ?AST 15 - 41 U/L 13    22    ?ALT 0 - 44 U/L 13    18    ? ? ?Lab Results  ?Component Value Date  ? WBC 5.4 04/26/2018  ? HGB 13.6 04/26/2018  ? HCT 42.2 04/26/2018  ? MCV 97.0 04/26/2018  ? PLT 245 04/26/2018  ? NEUTROABS 3.0 04/26/2018  ? ? ?ASSESSMENT & PLAN:  ?Malignant neoplasm of upper-outer quadrant of left breast in female, estrogen receptor positive ([place]) ?04/17/2018:Screening detected left breast asymmetry 0.5 cm by mammogram, ultrasound revealed 0.6 cm mass at 3 o'clock position axilla negative, biopsy revealed grade 2 ILC, ER 90%, PR 90%, Ki-67 1%, HER-2 -1+, T1b N0 stage Ia clinical stage ?Because surgery was delayed due to COVID-19, we started anastrozole 05/08/2018 ?  ?06/27/2018: Left lumpectomy: 0.8 cm grade 2 IDC with DCIS 0/5 lymph nodes negative, ER 90%, PR 90%, HER-2 -1+, Ki-67 1%, T1BN0 stage Ia ?  ?Current treatment:  Adjuvant antiestrogen therapy with anastrozole started March 2020 ?  ?Anastrozole Toxicities: None ?Breast cancer surveillance: ?1.  Breast exam 05/20/2021: Benign ?2.  Mammogram  07/24/2020: Benign. ?  ?Genetics: Neg for  mutations. ?Return to clinic in 1 year for follow-up ? ? ?No orders of the defined types were placed in this encounter. ? ?The patient has a good understanding of the overall plan. she agrees with it. she will call with any problems that may develop before the next visit here. ?Total time spent: 30 mins including face to face time and time spent for planning, charting and co-ordination of care ? ? Harriette Ohara, MD ?05/20/21 ? ?  I Gardiner Coins am scribing for Dr. Lindi Adie ? ?I have reviewed the above documentation for accuracy and completeness, and I agree with the above. ?  ?  ?

## 2021-05-20 NOTE — Assessment & Plan Note (Signed)
04/17/2018:Screening detected left breast asymmetry 0.5 cm by mammogram, ultrasound revealed 0.6 cm mass at 3 o'clock position axilla negative, biopsy revealed grade 2 ILC, ER 90%, PR 90%, Ki-67 1%, HER-2 -1+, T1b?N0 stage Ia clinical stage ?Because surgery was delayed due to COVID-19, we started anastrozole 05/08/2018 ?? ?06/27/2018: Left lumpectomy:?0.8 cm grade 2 IDC with DCIS 0/5 lymph nodes negative, ER 90%, PR 90%, HER-2 -1+, Ki-67 1%, T1BN0 stage Ia ?? ?Current treatment:??Adjuvant antiestrogen therapy with anastrozole?started March 2020 ?? ?Anastrozole Toxicities: None ?Breast cancer surveillance: ?1.??Breast exam 05/20/2021: Benign ?2.??Mammogram  07/24/2020: Benign. ?? ?Genetics: Neg for mutations. ?Return to clinic in?1 year for follow-up ?

## 2021-05-21 ENCOUNTER — Telehealth: Payer: Self-pay | Admitting: Hematology and Oncology

## 2021-05-21 NOTE — Telephone Encounter (Signed)
Scheduled appointment per 4/5 los. Unable to leave a voicemail due to mailbox being full. Patient will be mailed an updated calendar. ?

## 2021-07-06 DIAGNOSIS — I10 Essential (primary) hypertension: Secondary | ICD-10-CM | POA: Diagnosis not present

## 2021-07-06 DIAGNOSIS — F325 Major depressive disorder, single episode, in full remission: Secondary | ICD-10-CM | POA: Diagnosis not present

## 2021-07-06 DIAGNOSIS — R413 Other amnesia: Secondary | ICD-10-CM | POA: Diagnosis not present

## 2021-07-06 DIAGNOSIS — C50412 Malignant neoplasm of upper-outer quadrant of left female breast: Secondary | ICD-10-CM | POA: Diagnosis not present

## 2021-07-06 DIAGNOSIS — E039 Hypothyroidism, unspecified: Secondary | ICD-10-CM | POA: Diagnosis not present

## 2021-07-06 DIAGNOSIS — J302 Other seasonal allergic rhinitis: Secondary | ICD-10-CM | POA: Diagnosis not present

## 2021-07-22 DIAGNOSIS — Z780 Asymptomatic menopausal state: Secondary | ICD-10-CM | POA: Diagnosis not present

## 2021-07-23 ENCOUNTER — Telehealth: Payer: Self-pay | Admitting: *Deleted

## 2021-07-23 NOTE — Telephone Encounter (Signed)
RN attempt x1 to contact pt regarding recent bone density showing t score -0.5.  no answer, LVM for pt to return call to the office.

## 2021-08-12 DIAGNOSIS — S61452A Open bite of left hand, initial encounter: Secondary | ICD-10-CM | POA: Diagnosis not present

## 2021-08-12 DIAGNOSIS — W540XXA Bitten by dog, initial encounter: Secondary | ICD-10-CM | POA: Diagnosis not present

## 2021-09-04 DIAGNOSIS — E039 Hypothyroidism, unspecified: Secondary | ICD-10-CM | POA: Diagnosis not present

## 2022-01-22 DIAGNOSIS — F325 Major depressive disorder, single episode, in full remission: Secondary | ICD-10-CM | POA: Diagnosis not present

## 2022-01-22 DIAGNOSIS — F338 Other recurrent depressive disorders: Secondary | ICD-10-CM | POA: Diagnosis not present

## 2022-01-22 DIAGNOSIS — E669 Obesity, unspecified: Secondary | ICD-10-CM | POA: Diagnosis not present

## 2022-01-22 DIAGNOSIS — M1711 Unilateral primary osteoarthritis, right knee: Secondary | ICD-10-CM | POA: Diagnosis not present

## 2022-01-22 DIAGNOSIS — J302 Other seasonal allergic rhinitis: Secondary | ICD-10-CM | POA: Diagnosis not present

## 2022-01-22 DIAGNOSIS — C50412 Malignant neoplasm of upper-outer quadrant of left female breast: Secondary | ICD-10-CM | POA: Diagnosis not present

## 2022-01-22 DIAGNOSIS — Z0000 Encounter for general adult medical examination without abnormal findings: Secondary | ICD-10-CM | POA: Diagnosis not present

## 2022-01-22 DIAGNOSIS — I10 Essential (primary) hypertension: Secondary | ICD-10-CM | POA: Diagnosis not present

## 2022-01-22 DIAGNOSIS — E039 Hypothyroidism, unspecified: Secondary | ICD-10-CM | POA: Diagnosis not present

## 2022-01-22 DIAGNOSIS — Z23 Encounter for immunization: Secondary | ICD-10-CM | POA: Diagnosis not present

## 2022-05-24 ENCOUNTER — Inpatient Hospital Stay: Payer: Medicare PPO | Attending: Hematology and Oncology | Admitting: Hematology and Oncology

## 2022-05-24 NOTE — Assessment & Plan Note (Deleted)
04/17/2018:Screening detected left breast asymmetry 0.5 cm by mammogram, ultrasound revealed 0.6 cm mass at 3 o'clock position axilla negative, biopsy revealed grade 2 ILC, ER 90%, PR 90%, Ki-67 1%, HER-2 -1+, T1b N0 stage Ia clinical stage Because surgery was delayed due to COVID-19, we started anastrozole 05/08/2018   06/27/2018: Left lumpectomy: 0.8 cm grade 2 IDC with DCIS 0/5 lymph nodes negative, ER 90%, PR 90%, HER-2 -1+, Ki-67 1%, T1BN0 stage Ia   Current treatment:  Adjuvant antiestrogen therapy with anastrozole started March 2020   Anastrozole Toxicities: None Breast cancer surveillance: 1.  Breast exam 05/24/2022: Benign 2.  Mammogram  07/24/2020: Benign.   Bone density 07/22/2021: T-score +0.5: Normal Genetics: Neg for mutations. Return to clinic in 1 year for follow-up

## 2022-05-29 DIAGNOSIS — S0502XA Injury of conjunctiva and corneal abrasion without foreign body, left eye, initial encounter: Secondary | ICD-10-CM | POA: Diagnosis not present

## 2022-07-27 DIAGNOSIS — H26493 Other secondary cataract, bilateral: Secondary | ICD-10-CM | POA: Diagnosis not present

## 2022-07-27 DIAGNOSIS — H11442 Conjunctival cysts, left eye: Secondary | ICD-10-CM | POA: Diagnosis not present

## 2022-07-27 DIAGNOSIS — H16142 Punctate keratitis, left eye: Secondary | ICD-10-CM | POA: Diagnosis not present

## 2022-07-27 DIAGNOSIS — Z961 Presence of intraocular lens: Secondary | ICD-10-CM | POA: Diagnosis not present

## 2022-07-27 DIAGNOSIS — H43393 Other vitreous opacities, bilateral: Secondary | ICD-10-CM | POA: Diagnosis not present

## 2022-08-26 ENCOUNTER — Other Ambulatory Visit: Payer: Self-pay

## 2022-08-26 MED ORDER — ANASTROZOLE 1 MG PO TABS: ORAL_TABLET | ORAL | 0 refills | Status: DC

## 2022-09-21 NOTE — Progress Notes (Signed)
Patient Care Team: Laurann Montana, MD as PCP - General (Family Medicine) Pershing Proud, RN as Oncology Nurse Navigator Donnelly Angelica, RN as Oncology Nurse Navigator Serena Croissant, MD as Consulting Physician (Hematology and Oncology) Emelia Loron, MD as Consulting Physician (General Surgery) Lonie Peak, MD as Attending Physician (Radiation Oncology)  DIAGNOSIS:  Encounter Diagnosis  Name Primary?   Malignant neoplasm of upper-outer quadrant of left breast in female, estrogen receptor positive ([facility]) Yes    SUMMARY OF ONCOLOGIC HISTORY: Oncology History  Malignant neoplasm of upper-outer quadrant of left breast in female, estrogen receptor positive ([facility])  04/17/2018 Initial Diagnosis   Screening detected left breast asymmetry 0.5 cm by mammogram, ultrasound revealed 0.6 cm mass at 3 o'clock position axilla negative, biopsy revealed grade 2 ILC, ER 90%, PR 90%, Ki-67 1%, HER-2 -1+, T1b N0 stage Ia clinical stage   04/26/2018 Cancer Staging   Staging form: Breast, AJCC 8th Edition - Clinical stage from 04/26/2018: Stage IA (cT1b, cN0, cM0, G2, ER+, PR+, HER2-) - Signed by Serena Croissant, MD on 04/26/2018   04/26/2018 Genetic Testing   Negative.  Genes tested include: ATM, BRCA1, BRCA2, CDH1, CHEK2, PALB2, PTEN, STK11 and TP53.  APC, ATM, AXIN2, BARD1, BMPR1A, BRCA1, BRCA2, BRIP1, CDH1, CDKN2A (p14ARF), CDKN2A (p16INK4a), CKD4, CHEK2, CTNNA1, DICER1, EPCAM (Deletion/duplication testing only), GREM1 (promoter region deletion/duplication testing only), KIT, MEN1, MLH1, MSH2, MSH3, MSH6, MUTYH, NBN, NF1, NHTL1, PALB2, PDGFRA, PMS2, POLD1, POLE, PTEN, RAD50, RAD51C, RAD51D, SDHB, SDHC, SDHD, SMAD4, SMARCA4. STK11, TP53, TSC1, TSC2, and VHL.  The following genes were evaluated for sequence changes only: SDHA and HOXB13 c.251G>A variant only.   05/08/2018 -  Anti-estrogen oral therapy   Anastrozole 1 mg daily   06/27/2018 Surgery   Right lumpectomy Dwain Sarna): invasive lobular carcinoma  with DCIS, grade 2, 0.8cm, ER+ (90%), PR+ (90%), HER2- (1+), Ki67 1%, clear margins, 0/5 SNL negative for carcinoma.    07/12/2018 Cancer Staging   Staging form: Breast, AJCC 8th Edition - Pathologic: Stage IA (pT1b, pN0, cM0, G2, ER+, PR+, HER2-) - Signed by Loa Socks, NP on 07/12/2018     CHIEF COMPLIANT:  Follow-up of left breast cancer on anastrozole     INTERVAL HISTORY: Erika Huynh is a 33 with above-mentioned history of breast cancer is currently on anastrozole therapy and tolerating extremely well.  She denies any lumps or nodules in the breast.  Denies any pain or discomfort.  She unfortunately does not do much physical activity and spends most of her day in the chair.  Her daughter accompanied her to wants her to stay more active as well.   ALLERGIES:  is allergic to propoxyphene.  MEDICATIONS:  Current Outpatient Medications  Medication Sig Dispense Refill   acetaminophen (TYLENOL) 500 MG tablet Take 1,000 mg by mouth every 6 (six) hours as needed (for pain.).     anastrozole (ARIMIDEX) 1 MG tablet TTAKE 1 TABLET BY MOUTH DAILY 30 tablet 0   azelastine (ASTELIN) 0.1 % nasal spray 1-2 sprays in each nostril 1-2 times per day 30 mL 5   diphenhydrAMINE (BENADRYL) 25 MG tablet Take 25 mg by mouth every 6 (six) hours as needed.     escitalopram (LEXAPRO) 20 MG tablet Take 20 mg by mouth daily.      losartan (COZAAR) 100 MG tablet Take 100 mg by mouth daily.      meclizine (ANTIVERT) 25 MG tablet Take 25 mg by mouth 3 (three) times daily as needed for dizziness.  Melatonin 10 MG CAPS Take 10 mg by mouth at bedtime as needed (for sleep.).     montelukast (SINGULAIR) 10 MG tablet 1 tablet 1 time per day daily 30 tablet 5   SYNTHROID 175 MCG tablet      No current facility-administered medications for this visit.    PHYSICAL EXAMINATION: ECOG PERFORMANCE STATUS: 1 - Symptomatic but completely ambulatory  Vitals:   09/24/22 0852  BP: 131/63  Pulse: (!) 101   Resp: 18  Temp: (!) 97.2 F (36.2 C)  SpO2: 97%   Filed Weights   09/24/22 0852  Weight: 185 lb 3.2 oz (84 kg)    BREAST: No palpable masses or nodules in either right or left breasts. No palpable axillary supraclavicular or infraclavicular adenopathy no breast tenderness or nipple discharge. (exam performed in the presence of a chaperone)  LABORATORY DATA:  I have reviewed the data as listed    Latest Ref Rng & Units 04/26/2018    8:07 AM 12/09/2015    6:59 AM 12/01/2015   10:53 AM  CMP  Glucose 70 - 99 mg/dL 82  409  85   BUN 8 - 23 mg/dL 16  12  16    Creatinine 0.44 - 1.00 mg/dL 8.11  9.14  7.82   Sodium 135 - 145 mmol/L 141  138  141   Potassium 3.5 - 5.1 mmol/L 4.1  4.3  3.9   Chloride 98 - 111 mmol/L 105  102  107   CO2 22 - 32 mmol/L 27  28  28    Calcium 8.9 - 10.3 mg/dL 8.6  8.4  8.7   Total Protein 6.5 - 8.1 g/dL 6.7   6.1   Total Bilirubin 0.3 - 1.2 mg/dL 0.4   0.6   Alkaline Phos 38 - 126 U/L 97   77   AST 15 - 41 U/L 13   22   ALT 0 - 44 U/L 13   18     Lab Results  Component Value Date   WBC 5.4 04/26/2018   HGB 13.6 04/26/2018   HCT 42.2 04/26/2018   MCV 97.0 04/26/2018   PLT 245 04/26/2018   NEUTROABS 3.0 04/26/2018    ASSESSMENT & PLAN:  Malignant neoplasm of upper-outer quadrant of left breast in female, estrogen receptor positive ([facility]) 04/17/2018:Screening detected left breast asymmetry 0.5 cm by mammogram, ultrasound revealed 0.6 cm mass at 3 o'clock position axilla negative, biopsy revealed grade 2 ILC, ER 90%, PR 90%, Ki-67 1%, HER-2 -1+, T1b N0 stage Ia clinical stage Because surgery was delayed due to COVID-19, we started anastrozole 05/08/2018   06/27/2018: Left lumpectomy: 0.8 cm grade 2 IDC with DCIS 0/5 lymph nodes negative, ER 90%, PR 90%, HER-2 -1+, Ki-67 1%, T1BN0 stage Ia   Current treatment:  Adjuvant antiestrogen therapy with anastrozole started March 2020   Anastrozole Toxicities: None Recommended continuing anastrozole for 7  years. I sent a new prescription refill  Breast cancer surveillance: 1.  Breast exam 09/24/2022: Benign 2.  Mammogram  07/24/2020: Benign.  Patient had mammograms in June 2024.  We are trying to get those reports.  She had this at [facility].   Bone density 07/22/2021: T-score 0.5: Normal Genetics: Neg for mutations. Return to clinic in 1 year for follow-up    No orders of the defined types were placed in this encounter.  The patient has a good understanding of the overall plan. she agrees with it. she will call with any problems that may develop  before the next visit here. Total time spent: 30 mins including face to face time and time spent for planning, charting and co-ordination of care   Tamsen Meek, MD 09/24/22    I Janan Ridge am acting as a Neurosurgeon for [employer]  I have reviewed the above documentation for accuracy and completeness, and I agree with the above.

## 2022-09-22 DIAGNOSIS — Z8639 Personal history of other endocrine, nutritional and metabolic disease: Secondary | ICD-10-CM | POA: Diagnosis not present

## 2022-09-22 DIAGNOSIS — R630 Anorexia: Secondary | ICD-10-CM | POA: Diagnosis not present

## 2022-09-22 DIAGNOSIS — E039 Hypothyroidism, unspecified: Secondary | ICD-10-CM | POA: Diagnosis not present

## 2022-09-22 DIAGNOSIS — R5383 Other fatigue: Secondary | ICD-10-CM | POA: Diagnosis not present

## 2022-09-22 DIAGNOSIS — H01005 Unspecified blepharitis left lower eyelid: Secondary | ICD-10-CM | POA: Diagnosis not present

## 2022-09-22 DIAGNOSIS — D7589 Other specified diseases of blood and blood-forming organs: Secondary | ICD-10-CM | POA: Diagnosis not present

## 2022-09-22 DIAGNOSIS — Z03818 Encounter for observation for suspected exposure to other biological agents ruled out: Secondary | ICD-10-CM | POA: Diagnosis not present

## 2022-09-24 ENCOUNTER — Inpatient Hospital Stay: Payer: Medicare PPO | Attending: Hematology and Oncology | Admitting: Hematology and Oncology

## 2022-09-24 ENCOUNTER — Telehealth: Payer: Self-pay

## 2022-09-24 ENCOUNTER — Other Ambulatory Visit: Payer: Self-pay

## 2022-09-24 VITALS — BP 131/63 | HR 101 | Temp 97.2°F | Resp 18 | Ht 64.0 in | Wt 185.2 lb

## 2022-09-24 DIAGNOSIS — C50412 Malignant neoplasm of upper-outer quadrant of left female breast: Secondary | ICD-10-CM

## 2022-09-24 DIAGNOSIS — C50411 Malignant neoplasm of upper-outer quadrant of right female breast: Secondary | ICD-10-CM | POA: Diagnosis not present

## 2022-09-24 DIAGNOSIS — Z170 Estrogen receptor positive status [ER+]: Secondary | ICD-10-CM | POA: Insufficient documentation

## 2022-09-24 DIAGNOSIS — Z79811 Long term (current) use of aromatase inhibitors: Secondary | ICD-10-CM | POA: Insufficient documentation

## 2022-09-24 MED ORDER — ANASTROZOLE 1 MG PO TABS: ORAL_TABLET | ORAL | 3 refills | Status: DC

## 2022-09-24 NOTE — Telephone Encounter (Signed)
Called Pt regarding most recent MM. Pt relayed to MD that she has had MM every year with [facility]. Per [place], last MM was June 2022. Pt states she will call [facility] and make sure she is scheduled for a MM in 2024.

## 2022-09-24 NOTE — Assessment & Plan Note (Signed)
04/17/2018:Screening detected left breast asymmetry 0.5 cm by mammogram, ultrasound revealed 0.6 cm mass at 3 o'clock position axilla negative, biopsy revealed grade 2 ILC, ER 90%, PR 90%, Ki-67 1%, HER-2 -1+, T1b N0 stage Ia clinical stage Because surgery was delayed due to COVID-19, we started anastrozole 05/08/2018   06/27/2018: Left lumpectomy: 0.8 cm grade 2 IDC with DCIS 0/5 lymph nodes negative, ER 90%, PR 90%, HER-2 -1+, Ki-67 1%, T1BN0 stage Ia   Current treatment:  Adjuvant antiestrogen therapy with anastrozole started March 2020   Anastrozole Toxicities: None Breast cancer surveillance: 1.  Breast exam 09/24/2022: Benign 2.  Mammogram  07/24/2020: Benign.   Bone density 07/22/2021: T-score 0.5: Normal Genetics: Neg for mutations. Return to clinic in 1 year for follow-up

## 2022-09-29 DIAGNOSIS — E039 Hypothyroidism, unspecified: Secondary | ICD-10-CM | POA: Diagnosis not present

## 2022-09-29 DIAGNOSIS — E538 Deficiency of other specified B group vitamins: Secondary | ICD-10-CM | POA: Diagnosis not present

## 2022-09-29 DIAGNOSIS — I10 Essential (primary) hypertension: Secondary | ICD-10-CM | POA: Diagnosis not present

## 2022-09-29 DIAGNOSIS — C50412 Malignant neoplasm of upper-outer quadrant of left female breast: Secondary | ICD-10-CM | POA: Diagnosis not present

## 2022-09-29 DIAGNOSIS — R630 Anorexia: Secondary | ICD-10-CM | POA: Diagnosis not present

## 2022-09-29 DIAGNOSIS — F325 Major depressive disorder, single episode, in full remission: Secondary | ICD-10-CM | POA: Diagnosis not present

## 2022-09-29 DIAGNOSIS — R634 Abnormal weight loss: Secondary | ICD-10-CM | POA: Diagnosis not present

## 2022-09-29 DIAGNOSIS — R413 Other amnesia: Secondary | ICD-10-CM | POA: Diagnosis not present

## 2022-10-01 ENCOUNTER — Other Ambulatory Visit: Payer: Self-pay | Admitting: Family Medicine

## 2022-10-01 ENCOUNTER — Encounter: Payer: Self-pay | Admitting: Family Medicine

## 2022-10-01 DIAGNOSIS — R634 Abnormal weight loss: Secondary | ICD-10-CM

## 2022-10-06 DIAGNOSIS — E538 Deficiency of other specified B group vitamins: Secondary | ICD-10-CM | POA: Diagnosis not present

## 2022-10-06 DIAGNOSIS — Z1231 Encounter for screening mammogram for malignant neoplasm of breast: Secondary | ICD-10-CM | POA: Diagnosis not present

## 2022-10-13 DIAGNOSIS — E538 Deficiency of other specified B group vitamins: Secondary | ICD-10-CM | POA: Diagnosis not present

## 2022-10-14 DIAGNOSIS — R922 Inconclusive mammogram: Secondary | ICD-10-CM | POA: Diagnosis not present

## 2022-10-14 DIAGNOSIS — N63 Unspecified lump in unspecified breast: Secondary | ICD-10-CM | POA: Diagnosis not present

## 2022-10-19 ENCOUNTER — Other Ambulatory Visit: Payer: Self-pay | Admitting: Radiology

## 2022-10-19 DIAGNOSIS — N6312 Unspecified lump in the right breast, upper inner quadrant: Secondary | ICD-10-CM | POA: Diagnosis not present

## 2022-10-19 DIAGNOSIS — R922 Inconclusive mammogram: Secondary | ICD-10-CM | POA: Diagnosis not present

## 2022-10-19 DIAGNOSIS — N63 Unspecified lump in unspecified breast: Secondary | ICD-10-CM | POA: Diagnosis not present

## 2022-10-19 DIAGNOSIS — N6011 Diffuse cystic mastopathy of right breast: Secondary | ICD-10-CM | POA: Diagnosis not present

## 2022-10-20 ENCOUNTER — Other Ambulatory Visit: Payer: Self-pay

## 2022-10-20 ENCOUNTER — Emergency Department (HOSPITAL_BASED_OUTPATIENT_CLINIC_OR_DEPARTMENT_OTHER)
Admission: EM | Admit: 2022-10-20 | Discharge: 2022-10-20 | Disposition: A | Discharge status: Home / Self Care | Payer: Medicare PPO | Attending: Emergency Medicine | Admitting: Emergency Medicine

## 2022-10-20 DIAGNOSIS — Z853 Personal history of malignant neoplasm of breast: Secondary | ICD-10-CM | POA: Diagnosis not present

## 2022-10-20 DIAGNOSIS — N3000 Acute cystitis without hematuria: Secondary | ICD-10-CM | POA: Diagnosis not present

## 2022-10-20 DIAGNOSIS — R5381 Other malaise: Secondary | ICD-10-CM | POA: Insufficient documentation

## 2022-10-20 DIAGNOSIS — R630 Anorexia: Secondary | ICD-10-CM | POA: Insufficient documentation

## 2022-10-20 DIAGNOSIS — E0590 Thyrotoxicosis, unspecified without thyrotoxic crisis or storm: Secondary | ICD-10-CM | POA: Insufficient documentation

## 2022-10-20 LAB — CBC
HCT: 43.7 % (ref 36.0–46.0)
Hemoglobin: 14.8 g/dL (ref 12.0–15.0)
MCH: 33.9 pg (ref 26.0–34.0)
MCHC: 33.9 g/dL (ref 30.0–36.0)
MCV: 100 fL (ref 80.0–100.0)
Platelets: 183 10*3/uL (ref 150–400)
RBC: 4.37 MIL/uL (ref 3.87–5.11)
RDW: 12.7 % (ref 11.5–15.5)
WBC: 6.6 10*3/uL (ref 4.0–10.5)
nRBC: 0 % (ref 0.0–0.2)

## 2022-10-20 LAB — URINALYSIS, ROUTINE W REFLEX MICROSCOPIC
Bilirubin Urine: NEGATIVE
Glucose, UA: NEGATIVE mg/dL
Hgb urine dipstick: NEGATIVE
Ketones, ur: 15 mg/dL — AB
Nitrite: POSITIVE — AB
Protein, ur: 30 mg/dL — AB
Specific Gravity, Urine: 1.029 (ref 1.005–1.030)
WBC, UA: >50 WBC/hpf (ref 0–5)
pH: 5.5 (ref 5.0–8.0)

## 2022-10-20 LAB — COMPREHENSIVE METABOLIC PANEL
ALT: 11 U/L (ref 0–44)
AST: 23 U/L (ref 15–41)
Albumin: 4 g/dL (ref 3.5–5.0)
Alkaline Phosphatase: 74 U/L (ref 38–126)
Anion gap: 11 (ref 5–15)
BUN: 22 mg/dL (ref 8–23)
CO2: 27 mmol/L (ref 22–32)
Calcium: 9.3 mg/dL (ref 8.9–10.3)
Chloride: 100 mmol/L (ref 98–111)
Creatinine, Ser: 0.97 mg/dL (ref 0.44–1.00)
GFR, Estimated: 59 mL/min — ABNORMAL LOW (ref >60)
Glucose, Bld: 99 mg/dL (ref 70–99)
Potassium: 4.2 mmol/L (ref 3.5–5.1)
Sodium: 138 mmol/L (ref 135–145)
Total Bilirubin: 0.8 mg/dL (ref 0.3–1.2)
Total Protein: 7.2 g/dL (ref 6.5–8.1)

## 2022-10-20 LAB — LIPASE, BLOOD: Lipase: <10 U/L — ABNORMAL LOW (ref 11–51)

## 2022-10-20 LAB — TSH: TSH: 0.043 u[IU]/mL — ABNORMAL LOW (ref 0.350–4.500)

## 2022-10-20 LAB — T4, FREE: Free T4: 1.94 ng/dL — ABNORMAL HIGH (ref 0.61–1.12)

## 2022-10-20 MED ORDER — CEPHALEXIN 250 MG PO CAPS
1000.0000 mg | ORAL_CAPSULE | Freq: Once | ORAL | Status: AC
  Administered 2022-10-20: 1000 mg via ORAL
  Filled 2022-10-20: qty 4

## 2022-10-20 MED ORDER — CYANOCOBALAMIN 1000 MCG/ML IJ SOLN: 1000.0000 ug | Freq: Once | INTRAMUSCULAR | Status: DC

## 2022-10-20 MED ORDER — CEPHALEXIN 500 MG PO CAPS: 500.0000 mg | ORAL_CAPSULE | Freq: Four times a day (QID) | ORAL | 0 refills | Status: DC

## 2022-10-20 NOTE — ED Triage Notes (Signed)
Pt has been unable to tolerate or find interest in eating or drinking anything but ensure for past 4 days.  Pt reports 2 days ago she lost interest in drinking those as well and has been drinking water only.  Pt denies pain/nausea/weakness/sob or any other symptoms besides general malaise. Ambulates without difficulty.  NAD, AAOx4 in triage.

## 2022-10-20 NOTE — Discharge Instructions (Signed)
As we discussed your lab work did not suggest severe protein calorie malnutrition or dehydration.  You have normal kidney function and normal protein at this time.  Both of these suggest that you are not becoming critically dehydrated despite your lack of appetite.  I have noted that you have what seems to be a fairly severe urinary tract infection, this could be contributing to your lack of appetite.  I have prescribed an antibiotic to take for the next week, he will take 1 capsule by mouth 4 times daily starting tomorrow morning.  Additionally your TSH (thyroid-stimulating hormone) which tells [place] about your thyroid levels suggests that you are currently taking too much supplemental thyroid medication.  I recommend that you hold your thyroid medication until you talk to your doctor about adjusting your dose.  If your appetite does not improve despite treating your urinary tract infection, already you are unable to tolerate taking the antibiotic I prescribed, I recommend speaking to your doctor or returning to the emergency department for further evaluation especially if you are concerned that you are becoming dehydrated.  Until then I do recommend that you do your best to try to at least drink an Ensure a day, and 40 to 56 ounces of water per day.  If you begin having severe abdominal pain or inability to swallow please return for further evaluation.

## 2022-10-20 NOTE — ED Provider Notes (Signed)
Erika Huynh EMERGENCY DEPARTMENT AT [facility] Provider Note   CSN: [id number] Arrival date & time: 10/20/22  1211     History  Chief Complaint  Patient presents with   Anorexia    Erika Huynh is a 77 y.o. female with past medical history significant for breast cancer for which she is in remission, had been stage I, she did not require chemotherapy, or radiation, who presents with concern for generalized malaise for 1 to 2 months with decreased appetite for the last 6 days.  Patient reports that about 6 days ago she lost interest in eating, does not experience hunger at this time.  She reports that a few days later she stopped having desire to drink fluids, but was still able to tolerate Ensure.  For the last 4 days she is not even 1 to drink Ensure and barely tolerated drinking water.  Patient does endorse that she will gag when attempting to drink fluids but denies any feeling of throat spasm, throat pain, stomach pain or other abdominal pain.  No previous history of similar.  She denies any recent fever, chills, night sweats, or significant weight loss.  She had gone to the doctor regarding the symptoms and was diagnosed with low B12, reports that she is being treated for her low B12 but the malaise continues.  HPI     Home Medications Prior to Admission medications   Medication Sig Start Date End Date Taking? Authorizing Provider  cephALEXin (KEFLEX) 500 MG capsule Take 1 capsule (500 mg total) by mouth 4 (four) times daily. 10/20/22  Yes Francena Zender H, PA-C  acetaminophen (TYLENOL) 500 MG tablet Take 1,000 mg by mouth every 6 (six) hours as needed (for pain.).    [provider]  anastrozole (ARIMIDEX) 1 MG tablet TTAKE 1 TABLET BY MOUTH DAILY 09/24/22   Serena Croissant, MD  azelastine (ASTELIN) 0.1 % nasal spray 1-2 sprays in each nostril 1-2 times per day 05/22/20   Jessica Priest, MD  diphenhydrAMINE (BENADRYL) 25 MG tablet Take 25 mg by mouth every 6 (six)  hours as needed.    [provider]  escitalopram (LEXAPRO) 20 MG tablet Take 20 mg by mouth daily.  09/10/14   [provider]  losartan (COZAAR) 100 MG tablet Take 100 mg by mouth daily.  09/10/14   [provider]  meclizine (ANTIVERT) 25 MG tablet Take 25 mg by mouth 3 (three) times daily as needed for dizziness.    [provider]  Melatonin 10 MG CAPS Take 10 mg by mouth at bedtime as needed (for sleep.).    [provider]  montelukast (SINGULAIR) 10 MG tablet 1 tablet 1 time per day daily 05/14/20   Jessica Priest, MD  SYNTHROID 175 MCG tablet  06/12/19   [provider]      Allergies    Propoxyphene    Review of Systems   Review of Systems  All other systems reviewed and are negative.   Physical Exam Updated Vital Signs BP 137/75   Pulse 91   Temp 97.6 F (36.4 C) (Oral)   Resp 16   SpO2 99%  Physical Exam Vitals and nursing note reviewed.  Constitutional:      General: She is not in acute distress.    Appearance: Normal appearance.  HENT:     Head: Normocephalic and atraumatic.     Mouth/Throat:     Comments: No significant posterior oropharynx erythema, swelling, exudate. Uvula midline, tonsils  1+ bilaterally.  No trismus, stridor, evidence of PTA, floor of mouth swelling or redness.   Eyes:     General:        Right eye: No discharge.        Left eye: No discharge.  Neck:     Comments:  No cervical or supraclavicular lymphadenopathy noted Cardiovascular:     Rate and Rhythm: Normal rate and regular rhythm.     Heart sounds: No murmur heard.    No friction rub. No gallop.  Pulmonary:     Effort: Pulmonary effort is normal.     Breath sounds: Normal breath sounds.  Abdominal:     General: Bowel sounds are normal.     Palpations: Abdomen is soft.     Comments: No tenderness to palpation of the abdomen, no rebound, rigidity, guarding throughout.   Skin:    General: Skin is warm and dry.     Capillary  Refill: Capillary refill takes less than 2 seconds.  Neurological:     Mental Status: She is alert and oriented to person, place, and time.     Comments: Patient moving all 4 limbs spontaneously, has intact swallow reflex.  Psychiatric:        Mood and Affect: Mood normal.        Behavior: Behavior normal.     ED Results / Procedures / Treatments   Labs (all labs ordered are listed, but only abnormal results are displayed) Labs Reviewed  LIPASE, BLOOD - Abnormal; Notable for the following components:      Result Value   Lipase <10 (*)    All other components within normal limits  COMPREHENSIVE METABOLIC PANEL - Abnormal; Notable for the following components:   GFR, Estimated 59 (*)    All other components within normal limits  URINALYSIS, ROUTINE W REFLEX MICROSCOPIC - Abnormal; Notable for the following components:   APPearance HAZY (*)    Ketones, ur 15 (*)    Protein, ur 30 (*)    Nitrite POSITIVE (*)    Leukocytes,Ua LARGE (*)    Bacteria, UA MANY (*)    All other components within normal limits  TSH - Abnormal; Notable for the following components:   TSH 0.043 (*)    All other components within normal limits  CBC  T4, FREE    EKG None  Radiology No results found.  Procedures Procedures    Medications Ordered in ED Medications  cephALEXin (KEFLEX) capsule 1,000 mg (1,000 mg Oral Given 10/20/22 1539)    ED Course/ Medical Decision Making/ A&P                                 Medical Decision Making Amount and/or Complexity of Data Reviewed Labs: ordered.  Risk Prescription drug management.   This patient is a 77 y.o. female  who presents to the ED for concern of decreased appetite, weight loss, decreased interest in food, water, concern for possible dehydration or malnutrition.   Differential diagnoses prior to evaluation: The emergent differential diagnosis includes, but is not limited to, dysphagia, acute intra-abdominal infection, thyroid  abnormality, electrolyte abnormality, acute dehydration, cancer, versus other. This is not an exhaustive differential.   Past Medical History / Co-morbidities / Social History: Depression, anxiety, previous history of breast cancer which is in remission, hypothyroidism  Additional history: Chart reviewed. Pertinent results include: Reviewed recent out patient oncology notes, as well as remote lab  work, imaging from previous  Physical Exam: Physical exam performed. The pertinent findings include: Overall well-appearing, no dry mucous membranes, stable vital signs, exam  Lab Tests/Imaging studies: I personally interpreted labs/imaging and the pertinent results include: CMP overall unremarkable, CBC unremarkable.  TSH is low, 0.043, she has had to have adjustment of her Synthroid medication before, encouraged follow-up with PCP, but she should hold her Synthroid at this time.  Normal lipase.  UA with positive nitrate, large leukocytes, greater than 50 white blood cells and many bacteria, suspicious for acute urinary tract infection.    Medications: I ordered medication including Keflex for UTI, patient was able to tolerate without difficulty.  I have reviewed the patients home medicines and have made adjustments as needed.  Discussed I recommend treatment for acute UTI, holding her thyroid medication until able to speak with her primary care doctor.  She was able to tolerate p.o. in the emergency department and hold down her antibiotics.  Her lab work suggest no evidence of acute protein calorie malnutrition, or acute kidney injury suggesting severe malnutrition or dehydration.   Disposition: After consideration of the diagnostic results and the patients response to treatment, I feel that suspect that acute urinary tract infection may be contributing to patient's lack of appetite, discussed that if her symptoms continue despite treatment for UTI she should return for further evaluation or follow-up  with your primary care doctor, if significant decreased appetite, weight loss continues would raise concern for possible malignancy or thyroid issue with gastric emptying, although overall lower suspicion for gastric emptying etiology with no nausea, vomiting, abdominal pain.  No clear lymphadenopathy, night sweats, or other symptoms to increase suspicion for acute malignancy  emergency department workup does not suggest an emergent condition requiring admission or immediate intervention beyond what has been performed at this time. The plan is: As above, treat UTI, close follow-up with PCP or return to ED if decreased appetite persists. The patient is safe for discharge and has been instructed to return immediately for worsening symptoms, change in symptoms or any other concerns.  Final Clinical Impression(s) / ED Diagnoses Final diagnoses:  Decreased appetite  Acute cystitis without hematuria  Hyperthyroidism    Rx / DC Orders ED Discharge Orders          Ordered    cephALEXin (KEFLEX) 500 MG capsule  4 times daily        10/20/22 1603              Justyn Langham, [place] H, PA-C 10/20/22 1848    Franne Forts, DO 10/26/22 0010

## 2022-10-26 ENCOUNTER — Ambulatory Visit
Admission: RE | Admit: 2022-10-26 | Discharge: 2022-10-26 | Disposition: A | Discharge status: Home / Self Care | Payer: Medicare PPO | Source: Ambulatory Visit | Attending: Family Medicine | Admitting: Family Medicine

## 2022-10-26 DIAGNOSIS — R634 Abnormal weight loss: Secondary | ICD-10-CM | POA: Diagnosis not present

## 2022-10-26 DIAGNOSIS — K8020 Calculus of gallbladder without cholecystitis without obstruction: Secondary | ICD-10-CM | POA: Diagnosis not present

## 2022-10-26 DIAGNOSIS — K449 Diaphragmatic hernia without obstruction or gangrene: Secondary | ICD-10-CM | POA: Diagnosis not present

## 2022-10-26 DIAGNOSIS — I700 Atherosclerosis of aorta: Secondary | ICD-10-CM | POA: Diagnosis not present

## 2022-10-26 DIAGNOSIS — K5790 Diverticulosis of intestine, part unspecified, without perforation or abscess without bleeding: Secondary | ICD-10-CM | POA: Diagnosis not present

## 2022-10-26 DIAGNOSIS — R911 Solitary pulmonary nodule: Secondary | ICD-10-CM | POA: Diagnosis not present

## 2022-10-26 MED ORDER — IOPAMIDOL (ISOVUE-300) INJECTION 61%
100.0000 mL | Freq: Once | INTRAVENOUS | Status: AC | PRN
  Administered 2022-10-26: 100 mL via INTRAVENOUS

## 2022-10-27 DIAGNOSIS — E538 Deficiency of other specified B group vitamins: Secondary | ICD-10-CM | POA: Diagnosis not present

## 2022-11-03 DIAGNOSIS — E538 Deficiency of other specified B group vitamins: Secondary | ICD-10-CM | POA: Diagnosis not present

## 2022-11-05 ENCOUNTER — Telehealth: Payer: Self-pay | Admitting: *Deleted

## 2022-11-05 NOTE — Telephone Encounter (Signed)
VM left on nurse line from Dr Laurann Montana stating she order CT's on pt due to noted weight loss now showing masses in pancreas and lung - highly suspicious for cancer.  She states she called the referral line and spoke with the new pt coordinator- but did not get a clear understanding of how to proceed.  She is calling Dr Pamelia Hoit for advice and or taking lead in further testing for best outcome since he sees the patient for history of breast cancer.  This RN returned call to [phone] and informed Dr Cliffton Asters MD is out of the office at present  but information will be given to him on Monday.  Dr Cliffton Asters wanted Dr Pamelia Hoit to know per her  communication with Dr Dulce Sellar - he feels due to the location of mass in pancreas endoscopic biopsy not feasible.  His recommendation is to either proceed with IR evaluation for biopsy or send to pulmonologist for possible biopsy of lung mass.  Dr Cliffton Asters states family is aware of results.  Dr Cliffton Asters was very appreciative of return call and plan per discussion above.  This note with copy of scans showing concerns will be given to MD for review and further recommendations.

## 2022-11-08 ENCOUNTER — Telehealth: Payer: Self-pay

## 2022-11-08 ENCOUNTER — Telehealth: Payer: Self-pay | Admitting: Hematology and Oncology

## 2022-11-08 ENCOUNTER — Other Ambulatory Visit: Payer: Self-pay

## 2022-11-08 DIAGNOSIS — K8689 Other specified diseases of pancreas: Secondary | ICD-10-CM

## 2022-11-08 NOTE — Telephone Encounter (Signed)
-----   Message from [facility] sent at 11/08/2022  3:12 PM EDT ----- Regarding: RE: EUS GG, Thanks for reaching out. I did look at the imaging. Certainly with as significant of a hiatal hernia, if we have issues of trying to traverse into the abdominal cavity, then I can see Outlaw's concern about EUS being difficult. However, unless you put a scope in however, you cannot know what may or may not be possible with EUS. I am booked out for quite a few weeks at this point with my next availability not until October 17 or 28.Marland Kitchen However, I am going to put one of my medical assistant on here, as I may have a possible opening next week (pending another patient being rescheduled) but we will not know for sure until tomorrow at least. Once we know if that slot is open, Camille Dragan can reach out to patient. If the patient is willing to accept the chance that we may or may not be able to perform this as a result of her anatomy, I am certainly happy to be available otherwise she can be referred to [facility] or any of the other quaternary centers that as had been recommended otherwise. Thanks. GM  Larrell Rapozo, If we do have an opening next week, please reply back and then we can move forward with offering her EUS. Thanks. GM ----- Message ----- From: Serena Croissant, MD Sent: 11/08/2022  11:28 AM EDT To: Malachy Mood, MD; Lemar Lofty., MD Subject: EUS                                            Hello Vicente Serene, Can you please see review my patients CT scan and see if EUS can be done? She has a prior H/O breast cancer and now has a pancreatic mass.  Dr. Cliffton Asters had discussed this with Dr. Dulce Sellar who suggested that EUS is not possible because of her hiatal hernia and to consider referring her to [facility].  I wanted to run this case by you before we send her anywhere. If it is possible to do it here, I would like her to see you    Terrace Arabia, can you also please see her for this suspicion for pancreatic cancer.   Can wait on the biopsy.  But I want to get her under your radar.   Thanks [employer]

## 2022-11-08 NOTE — Telephone Encounter (Signed)
I discussed with Dr. Cliffton Asters and then subsequently called Ms. Sedivy. Patient was found on CT scan to have an enlarged pancreatic mass.  In addition there was also a lung nodule.  The pancreatic mass needs to be biopsied. I sent a message to Dr. Meridee Score to see if it could be done with a EUS. I discussed the case with interventional radiology.  They clarified that the lung nodule was a typo and it is only 5 mm in size and therefore a biopsy cannot be done.  I requested Dr. Mosetta Putt and the GI navigators to monitor her progress and get her in to be seen if this turns out to be primary pancreatic cancer.

## 2022-11-08 NOTE — Telephone Encounter (Signed)
Thanks Rovonda. I think this is a reasonable timeframe. GM  FYI all.

## 2022-11-08 NOTE — Telephone Encounter (Signed)
Patient has been scheduled for 11/18/22 @ [facility] for EUS. Patient will come by office on 11/09/22 to pick up instructions. Patient states that she will probably ask for me, just in case she has additional questions.Front desk staff will let me know, when patient arrives.

## 2022-11-09 ENCOUNTER — Encounter (HOSPITAL_COMMUNITY): Payer: Self-pay | Admitting: Gastroenterology

## 2022-11-09 ENCOUNTER — Other Ambulatory Visit: Payer: Self-pay

## 2022-11-10 ENCOUNTER — Other Ambulatory Visit: Payer: Self-pay

## 2022-11-10 DIAGNOSIS — Z170 Estrogen receptor positive status [ER+]: Secondary | ICD-10-CM

## 2022-11-10 DIAGNOSIS — K8689 Other specified diseases of pancreas: Secondary | ICD-10-CM

## 2022-11-16 NOTE — Progress Notes (Signed)
I spoke with Erika Huynh.  She is able to reschedule her consultation with Dr Serita Grammes, NP to 11/24/2022 at 1100. All questions were answered.  She verbalized understanding.

## 2022-11-17 ENCOUNTER — Inpatient Hospital Stay: Payer: Medicare PPO | Attending: Hematology and Oncology | Admitting: Nurse Practitioner

## 2022-11-17 ENCOUNTER — Inpatient Hospital Stay: Payer: Medicare PPO

## 2022-11-17 NOTE — Anesthesia Preprocedure Evaluation (Addendum)
Anesthesia Evaluation  Patient identified by MRN, date of birth, ID band Patient awake    Reviewed: Allergy & Precautions, NPO status , Patient's Chart, lab work & pertinent test results  Airway Mallampati: I  TM Distance: >3 FB Neck ROM: Full    Dental no notable dental hx. (+) Teeth Intact, Dental Advisory Given   Pulmonary neg pulmonary ROS   Pulmonary exam normal breath sounds clear to auscultation       Cardiovascular Exercise Tolerance: Good hypertension, Pt. on medications Normal cardiovascular exam Rhythm:Regular Rate:Normal     Neuro/Psych negative neurological ROS     GI/Hepatic negative GI ROS, Neg liver ROS,,,  Endo/Other  Hypothyroidism    Renal/GU negative Renal ROS  negative genitourinary   Musculoskeletal  (+) Arthritis ,    Abdominal   Peds  Hematology   Anesthesia Other Findings All: propoxyphene  Hx of Breast CA  Reproductive/Obstetrics negative OB ROS                             Anesthesia Physical Anesthesia Plan  ASA: 3  Anesthesia Plan: MAC   Post-op Pain Management: Minimal or no pain anticipated   Induction:   PONV Risk Score and Plan: Treatment may vary due to age or medical condition and Propofol infusion  Airway Management Planned: Natural Airway and Nasal Cannula  Additional Equipment: None  Intra-op Plan:   Post-operative Plan:   Informed Consent: I have reviewed the patients History and Physical, chart, labs and discussed the procedure including the risks, benefits and alternatives for the proposed anesthesia with the patient or authorized representative who has indicated his/her understanding and acceptance.     Dental advisory given  Plan Discussed with: CRNA  Anesthesia Plan Comments: (Upper endoscopic ultrasound for pancreatic mass)        Anesthesia Quick Evaluation

## 2022-11-18 ENCOUNTER — Ambulatory Visit (HOSPITAL_COMMUNITY)
Admission: RE | Admit: 2022-11-18 | Discharge: 2022-11-18 | Disposition: A | Discharge status: Home / Self Care | Payer: Medicare PPO | Attending: Gastroenterology | Admitting: Gastroenterology

## 2022-11-18 ENCOUNTER — Ambulatory Visit (HOSPITAL_COMMUNITY): Payer: Self-pay | Admitting: Anesthesiology

## 2022-11-18 ENCOUNTER — Other Ambulatory Visit: Payer: Self-pay

## 2022-11-18 ENCOUNTER — Encounter (HOSPITAL_COMMUNITY)
Admission: RE | Disposition: A | Discharge status: Home / Self Care | Payer: Self-pay | Source: Home / Self Care | Attending: Gastroenterology

## 2022-11-18 ENCOUNTER — Encounter (HOSPITAL_COMMUNITY): Payer: Self-pay | Admitting: Gastroenterology

## 2022-11-18 ENCOUNTER — Ambulatory Visit (HOSPITAL_COMMUNITY): Payer: Medicare PPO | Admitting: Anesthesiology

## 2022-11-18 DIAGNOSIS — K2950 Unspecified chronic gastritis without bleeding: Secondary | ICD-10-CM | POA: Insufficient documentation

## 2022-11-18 DIAGNOSIS — K2970 Gastritis, unspecified, without bleeding: Secondary | ICD-10-CM | POA: Diagnosis not present

## 2022-11-18 DIAGNOSIS — E039 Hypothyroidism, unspecified: Secondary | ICD-10-CM

## 2022-11-18 DIAGNOSIS — M1990 Unspecified osteoarthritis, unspecified site: Secondary | ICD-10-CM | POA: Diagnosis not present

## 2022-11-18 DIAGNOSIS — C250 Malignant neoplasm of head of pancreas: Secondary | ICD-10-CM | POA: Insufficient documentation

## 2022-11-18 DIAGNOSIS — K3189 Other diseases of stomach and duodenum: Secondary | ICD-10-CM | POA: Insufficient documentation

## 2022-11-18 DIAGNOSIS — N281 Cyst of kidney, acquired: Secondary | ICD-10-CM | POA: Insufficient documentation

## 2022-11-18 DIAGNOSIS — K449 Diaphragmatic hernia without obstruction or gangrene: Secondary | ICD-10-CM | POA: Diagnosis not present

## 2022-11-18 DIAGNOSIS — K8689 Other specified diseases of pancreas: Secondary | ICD-10-CM | POA: Diagnosis not present

## 2022-11-18 DIAGNOSIS — I10 Essential (primary) hypertension: Secondary | ICD-10-CM | POA: Insufficient documentation

## 2022-11-18 DIAGNOSIS — Z853 Personal history of malignant neoplasm of breast: Secondary | ICD-10-CM | POA: Diagnosis not present

## 2022-11-18 DIAGNOSIS — C259 Malignant neoplasm of pancreas, unspecified: Secondary | ICD-10-CM | POA: Diagnosis not present

## 2022-11-18 DIAGNOSIS — R933 Abnormal findings on diagnostic imaging of other parts of digestive tract: Secondary | ICD-10-CM | POA: Diagnosis present

## 2022-11-18 HISTORY — PX: BIOPSY: SHX5522

## 2022-11-18 HISTORY — PX: FINE NEEDLE ASPIRATION: SHX5430

## 2022-11-18 HISTORY — PX: UPPER ESOPHAGEAL ENDOSCOPIC ULTRASOUND (EUS): SHX6562

## 2022-11-18 HISTORY — PX: ESOPHAGOGASTRODUODENOSCOPY (EGD) WITH PROPOFOL: SHX5813

## 2022-11-18 LAB — COMPREHENSIVE METABOLIC PANEL
ALT: 28 U/L (ref 0–44)
AST: 40 U/L (ref 15–41)
Albumin: 3.4 g/dL — ABNORMAL LOW (ref 3.5–5.0)
Alkaline Phosphatase: 109 U/L (ref 38–126)
Anion gap: 16 — ABNORMAL HIGH (ref 5–15)
BUN: 18 mg/dL (ref 8–23)
CO2: 27 mmol/L (ref 22–32)
Calcium: 8.9 mg/dL (ref 8.9–10.3)
Chloride: 93 mmol/L — ABNORMAL LOW (ref 98–111)
Creatinine, Ser: 0.95 mg/dL (ref 0.44–1.00)
GFR, Estimated: >60 mL/min (ref >60)
Glucose, Bld: 110 mg/dL — ABNORMAL HIGH (ref 70–99)
Potassium: 3 mmol/L — ABNORMAL LOW (ref 3.5–5.1)
Sodium: 136 mmol/L (ref 135–145)
Total Bilirubin: 1.2 mg/dL (ref 0.3–1.2)
Total Protein: 6.3 g/dL — ABNORMAL LOW (ref 6.5–8.1)

## 2022-11-18 SURGERY — UPPER ESOPHAGEAL ENDOSCOPIC ULTRASOUND (EUS)
Anesthesia: Monitor Anesthesia Care

## 2022-11-18 MED ORDER — LIDOCAINE 2% (20 MG/ML) 5 ML SYRINGE
INTRAMUSCULAR | Status: DC | PRN
Start: 2022-11-18 — End: 2022-11-18
  Administered 2022-11-18: 60 mg via INTRAVENOUS

## 2022-11-18 MED ORDER — PANTOPRAZOLE SODIUM 40 MG PO TBEC
40.0000 mg | DELAYED_RELEASE_TABLET | Freq: Every day | ORAL | 6 refills | Status: DC
Start: 2022-11-18 — End: 2023-03-08

## 2022-11-18 MED ORDER — PROPOFOL 500 MG/50ML IV EMUL
INTRAVENOUS | Status: DC | PRN
  Administered 2022-11-18: 125 ug/kg/min via INTRAVENOUS

## 2022-11-18 MED ORDER — LACTATED RINGERS IV SOLN: INTRAVENOUS | Status: DC

## 2022-11-18 MED ORDER — FENTANYL CITRATE (PF) 100 MCG/2ML IJ SOLN
INTRAMUSCULAR | Status: DC | PRN
Start: 2022-11-18 — End: 2022-11-18
  Administered 2022-11-18: 50 ug via INTRAVENOUS

## 2022-11-18 MED ORDER — PROPOFOL 10 MG/ML IV BOLUS
INTRAVENOUS | Status: DC | PRN
Start: 2022-11-18 — End: 2022-11-18
  Administered 2022-11-18: 30 mg via INTRAVENOUS

## 2022-11-18 MED ORDER — SODIUM CHLORIDE 0.9 % IV SOLN: INTRAVENOUS | Status: DC

## 2022-11-18 MED ORDER — CIPROFLOXACIN IN D5W 400 MG/200ML IV SOLN
INTRAVENOUS | Status: AC
  Filled 2022-11-18: qty 200

## 2022-11-18 MED ORDER — FENTANYL CITRATE (PF) 100 MCG/2ML IJ SOLN
INTRAMUSCULAR | Status: AC
  Filled 2022-11-18: qty 2

## 2022-11-18 NOTE — H&P (Signed)
GASTROENTEROLOGY PROCEDURE H&P NOTE   Primary Care Physician: Laurann Montana, MD  HPI: Erika Huynh is a 77 y.o. female who presents for EGD/EUS to evaluate a pancreatic lesion/mass in [name], however has a large hiatal hernia with majority of stomach in chest cavity.  Past Medical History:  Diagnosis Date   Anxiety    Arthritis    Breast cancer (HCC)    Depression    Family history of breast cancer    Family history of lung cancer    Family history of pancreatic cancer    Family history of throat cancer    Headache    Hypertension    Hypothyroidism    Past Surgical History:  Procedure Laterality Date   ABDOMINAL HYSTERECTOMY     ADENOIDECTOMY     BREAST LUMPECTOMY WITH RADIOACTIVE SEED AND SENTINEL LYMPH NODE BIOPSY Left 06/27/2018   Procedure: LEFT BREAST LUMPECTOMY WITH RADIOACTIVE SEED AND LEFT AXILLARY SENTINEL LYMPH NODE BIOPSY;  Surgeon: Emelia Loron, MD;  Location: [facility];  Service: General;  Laterality: Left;   BREAST SURGERY     cyst removed   PARTIAL KNEE ARTHROPLASTY Right 12/08/2015   Procedure: UNICOMPARTMENTAL KNEE;  Surgeon: Dannielle Huh, MD;  Location: [facility] OR;  Service: Orthopedics;  Laterality: Right;   TONSILLECTOMY     Current Facility-Administered Medications  Medication Dose Route Frequency Provider Last Rate Last Admin   0.9 %  sodium chloride infusion   Intravenous Continuous Mansouraty, Netty Starring., MD       lactated ringers infusion   Intravenous Continuous Mansouraty, Netty Starring., MD        Current Facility-Administered Medications:    0.9 %  sodium chloride infusion, , Intravenous, Continuous, Mansouraty, Netty Starring., MD   lactated ringers infusion, , Intravenous, Continuous, Mansouraty, Netty Starring., MD Allergies  Allergen Reactions   Propoxyphene Nausea Only and Other (See Comments)    NO APPETITE CAN NOT EAT Other reaction(s): vomiting   Family History  Problem Relation Age of Onset   Lung cancer Father     Breast cancer Maternal Aunt    Throat cancer Maternal Uncle    Pancreatic cancer Maternal Grandmother    Social History   Socioeconomic History   Marital status: Married    Spouse name: Not on file   Number of children: Not on file   Years of education: Not on file   Highest education level: Not on file  Occupational History   Not on file  Tobacco Use   Smoking status: Never   Smokeless tobacco: Never  Vaping Use   Vaping status: Never Used  Substance and Sexual Activity   Alcohol use: Yes    Alcohol/week: 0.0 standard drinks of alcohol    Comment: occas.   Drug use: No   Sexual activity: Not on file  Other Topics Concern   Not on file  Social History Narrative   Not on file   Social Determinants of Health   Financial Resource Strain: Not on file  Food Insecurity: Not on file  Transportation Needs: Not on file  Physical Activity: Not on file  Stress: Not on file  Social Connections: Not on file  Intimate Partner Violence: Not on file    Physical Exam: Today's Vitals   11/09/22 1044 11/18/22 0944  BP:  (!) 111/52  Pulse:  87  Resp:  18  Temp:  (!) 97 F (36.1 C)  TempSrc:  Temporal  SpO2:  98%  Weight: 90.7 kg 90.7 kg  Height: 5\' 6"  (1.676 m) 5\' 6"  (1.676 m)  PainSc:  0-No pain   Body mass index is 32.28 kg/m. GEN: NAD EYE: Sclerae anicteric ENT: MMM CV: Non-tachycardic GI: Soft, NT/ND NEURO:  Alert & Oriented x 3  Lab Results: No results for input(s): "WBC", "HGB", "HCT", "PLT" in the last 72 hours. BMET No results for input(s): "NA", "K", "CL", "CO2", "GLUCOSE", "BUN", "CREATININE", "CALCIUM" in the last 72 hours. LFT No results for input(s): "PROT", "ALBUMIN", "AST", "ALT", "ALKPHOS", "BILITOT", "BILIDIR", "IBILI" in the last 72 hours. PT/INR No results for input(s): "LABPROT", "INR" in the last 72 hours.   Impression / Plan: This is a 77 y.o.female who presents for EGD/EUS to evaluate a pancreatic lesion/mass in [name], however has a large  hiatal hernia with majority of stomach in chest cavity.  The risks of an EUS including intestinal perforation, bleeding, infection, aspiration, and medication effects were discussed as was the possibility it may not give a definitive diagnosis if a biopsy is performed.  When a biopsy of the pancreas is done as part of the EUS, there is an additional risk of pancreatitis at the rate of about 1-2%.  It was explained that procedure related pancreatitis is typically mild, although it can be severe and even life threatening, which is why we do not perform random pancreatic biopsies and only biopsy a lesion/area we feel is concerning enough to warrant the risk.   The risks and benefits of endoscopic evaluation/treatment were discussed with the patient and/or family; these include but are not limited to the risk of perforation, infection, bleeding, missed lesions, lack of diagnosis, severe illness requiring hospitalization, as well as anesthesia and sedation related illnesses.  The patient's history has been reviewed, patient examined, no change in status, and deemed stable for procedure.  The patient and/or family is agreeable to proceed.    Erika Parish, MD [facility] Advanced Endoscopy Office # [phone]

## 2022-11-18 NOTE — Transfer of Care (Signed)
Immediate Anesthesia Transfer of Care Note  Patient: LINDAY RHODES  Procedure(s) Performed: UPPER ESOPHAGEAL ENDOSCOPIC ULTRASOUND (EUS) BIOPSY FINE NEEDLE ASPIRATION (FNA) LINEAR  Patient Location: Endoscopy Unit  Anesthesia Type:MAC  Level of Consciousness: awake and drowsy  Airway & Oxygen Therapy: Patient Spontanous Breathing  Post-op Assessment: Report given to RN and Post -op Vital signs reviewed and stable  Post vital signs: Reviewed and stable  Last Vitals:  Vitals Value Taken Time  BP 140/76 11/18/22 1130  Temp 36 C 11/18/22 1130  Pulse 82 11/18/22 1133  Resp 14 11/18/22 1133  SpO2 98 % 11/18/22 1133  Vitals shown include unfiled device data.  Last Pain:  Vitals:   11/18/22 1130  TempSrc: Temporal  PainSc: 0-No pain         Complications: No notable events documented.

## 2022-11-18 NOTE — Anesthesia Postprocedure Evaluation (Signed)
Anesthesia Post Note  Patient: Erika Huynh  Procedure(s) Performed: UPPER ESOPHAGEAL ENDOSCOPIC ULTRASOUND (EUS) BIOPSY FINE NEEDLE ASPIRATION (FNA) LINEAR     Patient location during evaluation: Endoscopy Anesthesia Type: MAC Level of consciousness: awake and alert Pain management: pain level controlled Vital Signs Assessment: post-procedure vital signs reviewed and stable Respiratory status: spontaneous breathing, nonlabored ventilation, respiratory function stable and patient connected to nasal cannula oxygen Cardiovascular status: blood pressure returned to baseline and stable Postop Assessment: no apparent nausea or vomiting Anesthetic complications: no   No notable events documented.  Last Vitals:  Vitals:   11/18/22 1140 11/18/22 1150  BP: 126/72 (!) 145/68  Pulse: 91 80  Resp: (!) 24 17  Temp:    SpO2: 99% 100%    Last Pain:  Vitals:   11/18/22 1150  TempSrc:   PainSc: 0-No pain                 [name]

## 2022-11-18 NOTE — Discharge Instructions (Signed)

## 2022-11-18 NOTE — Op Note (Signed)
[facility] Patient Name: Erika Huynh Procedure Date: 11/18/2022 MRN: [id number] Attending MD: Corliss Parish , MD, [id number] Date of Birth: 02/03/1946 CSN: [id number] Age: 77 Admit Type: Outpatient Procedure:                Upper EUS Indications:              Suspected mass in pancreas on CT scan Providers:                Corliss Parish, MD, Margaree Mackintosh, RN,                            Kandice Robinsons, Technician Referring MD:             Serena Croissant, Troy Sine Solon Augusta White Medicines:                Monitored Anesthesia Care Complications:            No immediate complications. Estimated Blood Loss:     Estimated blood loss was minimal. Procedure:                Pre-Anesthesia Assessment:                           - Prior to the procedure, a History and Physical                            was performed, and patient medications and                            allergies were reviewed. The patient's tolerance of                            previous anesthesia was also reviewed. The risks                            and benefits of the procedure and the sedation                            options and risks were discussed with the patient.                            All questions were answered, and informed consent                            was obtained. Prior Anticoagulants: The patient has                            taken no anticoagulant or antiplatelet agents. ASA                            Grade Assessment: III - A patient with severe                            systemic disease. After reviewing the risks and  benefits, the patient was deemed in satisfactory                            condition to undergo the procedure.                           After obtaining informed consent, the endoscope was                            passed under direct vision. Throughout the                            procedure, the  patient's blood pressure, pulse, and                            oxygen saturations were monitored continuously. The                            [id number]-H190 ([id number] Olympus endoscope was introduced                            through the mouth, and advanced to the second part                            of duodenum. The [id number] ([id number]) Olympus linear                            ultrasound scope was introduced through the mouth,                            and advanced to the duodenum for ultrasound                            examination from the stomach and duodenum. The                            upper EUS was technically difficult and complex due                            to abnormal anatomy. Successful completion of the                            procedure was aided by performing the maneuvers                            documented (below) in this report. The patient                            tolerated the procedure. Scope In: Scope Out: Findings:      ENDOSCOPIC FINDING: :      No gross lesions were noted in the entire esophagus.      The Z-line was irregular and was found 34 cm from the incisors.      A large hiatal hernia was present.  An angulation deformity was found in the gastric antrum (I believe this       to likely be the diaphragmatic hiatus in this patient with large       intrathoracic hiatal hernia).      Patchy moderate inflammation characterized by erosions and erythema was       found in the entire examined stomach. Biopsies were taken with a cold       forceps for histology and Helicobacter pylori testing.      No gross lesions were noted in the duodenal bulb, in the first portion       of the duodenum and in the second portion of the duodenum.      The major papilla was normal.      To allow for adequate visualization in this large intrathoracic hiatal       hernia, I placed a long 0.035 Jagwire into the duodenum to allow me a       map for passage of the linear  echoendoscope.      ENDOSONOGRAPHIC FINDING: :      An irregular mass was identified in the pancreatic head. The mass was       hypoechoic. The mass measured 42 mm by 35 mm in maximal cross-sectional       diameter. The endosonographic borders were poorly-defined. 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       Unfortunately due to the patient's anatomy, visualization of the celiac       artery and SMA and gastroduodenal and common hepatic arteries is not       able to be documented. The remainder of the pancreas was examined and I       could only visualize a portion of the neck of the pancreas again due to       the patient's anatomy with the majority of her stomach body in the       thoracic region so I could only visualize a small amount of dilated       pancreatic head and neck duct (4.6 mm). Fine needle biopsy was performed       of the mass. Color Doppler imaging was utilized prior to needle puncture       to confirm a lack of significant vascular structures within the needle       path. Seven passes were made with the 22 gauge Acquire biopsy needle       using a transduodenal approach. A visible core of tissue was obtained.       Preliminary cytologic examination and touch preps were performed. The       cellularity of the specimen was adequate. Final cytology results are       pending.      Extensive hyperechoic material consistent with sludge was visualized       endosonographically in the gallbladder.      Endosonographic imaging in the visualized portion of the liver showed no       mass.      An anechoic lesion suggestive of a cyst was identified in the right       kidney. There were a few compartments thinly septated. The outer wall of       the lesion was thin. Impression:               EGD impression:                           -  No gross lesions in the entire esophagus. Z-line                            irregular, 34 cm from the  incisors.                           - Large hiatal hernia (the majority of her stomach                            is intrathoracic).                           - Angulation deformity in the gastric antrum -I                            believe this is where the diaphragmatic hiatus is                            present..                           - Gastritis. Biopsied.                           - No gross lesions in the duodenal bulb, in the                            first portion of the duodenum and in the second                            portion of the duodenum.                           - Normal major papilla.                           EUS impression:                           - A mass was identified in the pancreatic head.                            Cytology results are pending. However, the                            endosonographic appearance is suspicious for                            adenocarcinoma. This was staged T3 N0 Mx by                            endosonographic criteria. The staging applies if                            malignancy is confirmed. Fine needle biopsy  performed. The caveat here is that as a result of                            the patient's anatomy, I cannot truly visualize the                            celiac or superior mesenteric or gastroduodenal or                            common hepatic arteries/veins. Thus imaging would                            be more ideal for staging purposes.                           - Hyperechoic material consistent with sludge was                            visualized endosonographically in the gallbladder.                           - A cystic lesion was identified in the right                            kidney. Moderate Sedation:      Not Applicable - Patient had care per Anesthesia. Recommendation:           - The patient will be observed post-procedure,                            until all  discharge criteria are met.                           - Discharge patient to home.                           - Patient has a contact number available for                            emergencies. The signs and symptoms of potential                            delayed complications were discussed with the                            patient. Return to normal activities tomorrow.                            Written discharge instructions were provided to the                            patient.                           - Low fat diet.                           -  Monitor for signs/symptoms of bleeding,                            perforation, and infection. If issues please call                            our number to get further assistance as needed.                           - Observe patient's clinical course.                           - Await cytology results and await path results.                           - Follow-up CA 19?"9 and CMP.                           - If patient develops obstruction in the future,                            ERCP attempt may be reasonable, but complexity is                            likely present as a result of the large                            intrathoracic hernia and so this needs to be kept                            in mind, where percutaneous biliary drainage may                            need to be considered if difficult to obtain ERCP                            positioning.                           - The findings and recommendations were discussed                            with the patient.                           - The findings and recommendations were discussed                            with the patient's family. Procedure Code(s):        --- Professional ---                           [phone], Esophagogastroduodenoscopy, flexible,  transoral; with transendoscopic ultrasound-guided                             intramural or transmural fine needle                            aspiration/biopsy(s), (includes endoscopic                            ultrasound examination limited to the esophagus,                            stomach or duodenum, and adjacent structures)                           43239, 59, Esophagogastroduodenoscopy, flexible,                            transoral; with biopsy, single or multiple Diagnosis Code(s):        --- Professional ---                           N28.89, Other specified disorders of kidney and                            ureter                           K22.89, Other specified disease of esophagus                           K44.9, Diaphragmatic hernia without obstruction or                            gangrene                           K31.89, Other diseases of stomach and duodenum                           K29.70, Gastritis, unspecified, without bleeding                           K86.89, Other specified diseases of pancreas                           K83.8, Other specified diseases of biliary tract                           R93.3, Abnormal findings on diagnostic imaging of                            other parts of digestive tract CPT copyright 2022 [employer]. All rights reserved. The codes documented in this report are preliminary and upon coder review may  be revised to meet current compliance requirements. Corliss Parish, MD 11/18/2022 11:40:53 AM Number of Addenda: 0

## 2022-11-19 LAB — CANCER ANTIGEN 19-9: CA 19-9: 429 U/mL — ABNORMAL HIGH (ref 0–35)

## 2022-11-22 ENCOUNTER — Encounter (HOSPITAL_COMMUNITY): Payer: Self-pay | Admitting: Gastroenterology

## 2022-11-22 ENCOUNTER — Other Ambulatory Visit: Payer: Self-pay | Admitting: Genetic Counselor

## 2022-11-22 ENCOUNTER — Encounter: Payer: Self-pay | Admitting: Gastroenterology

## 2022-11-22 DIAGNOSIS — Z1379 Encounter for other screening for genetic and chromosomal anomalies: Secondary | ICD-10-CM

## 2022-11-22 LAB — SURGICAL PATHOLOGY

## 2022-11-22 LAB — CYTOLOGY - NON PAP

## 2022-11-23 ENCOUNTER — Other Ambulatory Visit: Payer: Medicare PPO

## 2022-11-23 ENCOUNTER — Ambulatory Visit: Payer: Medicare PPO | Admitting: Hematology

## 2022-11-24 ENCOUNTER — Inpatient Hospital Stay: Payer: Medicare PPO | Attending: Hematology and Oncology | Admitting: Nurse Practitioner

## 2022-11-24 ENCOUNTER — Other Ambulatory Visit: Payer: Medicare PPO

## 2022-11-24 ENCOUNTER — Inpatient Hospital Stay: Payer: Medicare PPO

## 2022-11-24 ENCOUNTER — Other Ambulatory Visit: Payer: Self-pay

## 2022-11-24 ENCOUNTER — Encounter: Payer: Self-pay | Admitting: Nurse Practitioner

## 2022-11-24 VITALS — BP 138/87 | HR 96 | Temp 97.9°F | Resp 18 | Ht 66.0 in | Wt 162.0 lb

## 2022-11-24 DIAGNOSIS — Z79811 Long term (current) use of aromatase inhibitors: Secondary | ICD-10-CM | POA: Insufficient documentation

## 2022-11-24 DIAGNOSIS — C50812 Malignant neoplasm of overlapping sites of left female breast: Secondary | ICD-10-CM | POA: Diagnosis not present

## 2022-11-24 DIAGNOSIS — R918 Other nonspecific abnormal finding of lung field: Secondary | ICD-10-CM | POA: Diagnosis not present

## 2022-11-24 DIAGNOSIS — Z170 Estrogen receptor positive status [ER+]: Secondary | ICD-10-CM | POA: Diagnosis not present

## 2022-11-24 DIAGNOSIS — K449 Diaphragmatic hernia without obstruction or gangrene: Secondary | ICD-10-CM

## 2022-11-24 DIAGNOSIS — R634 Abnormal weight loss: Secondary | ICD-10-CM | POA: Diagnosis not present

## 2022-11-24 DIAGNOSIS — R110 Nausea: Secondary | ICD-10-CM

## 2022-11-24 DIAGNOSIS — C250 Malignant neoplasm of head of pancreas: Secondary | ICD-10-CM | POA: Insufficient documentation

## 2022-11-24 DIAGNOSIS — Z1379 Encounter for other screening for genetic and chromosomal anomalies: Secondary | ICD-10-CM

## 2022-11-24 DIAGNOSIS — C259 Malignant neoplasm of pancreas, unspecified: Secondary | ICD-10-CM

## 2022-11-24 LAB — GENETIC SCREENING ORDER

## 2022-11-24 MED ORDER — DRONABINOL 2.5 MG PO CAPS
2.5000 mg | ORAL_CAPSULE | Freq: Two times a day (BID) | ORAL | 0 refills | Status: DC
Start: 2022-11-24 — End: 2022-11-25

## 2022-11-24 MED ORDER — ONDANSETRON 4 MG PO TBDP
4.0000 mg | ORAL_TABLET | Freq: Three times a day (TID) | ORAL | 0 refills | Status: DC | PRN
Start: 2022-11-24 — End: 2022-12-12

## 2022-11-24 NOTE — Progress Notes (Signed)
[facility]  [phone]   HEMATOLOGY ONCOLOGY  Outpatient consultation   Erika Huynh  DOB: [birth year]  MR#: [id number]  CSN#: [id number]    Requesting Physician: [facility]  Patient Care Team: Laurann Montana, MD as PCP - General (Family Medicine) Pershing Proud, RN as Oncology Nurse Navigator Donnelly Angelica, RN as Oncology Nurse Navigator Serena Croissant, MD as Consulting Physician (Hematology and Oncology) Emelia Loron, MD as Consulting Physician (General Surgery) Lonie Peak, MD as Attending Physician (Radiation Oncology)  Reason for consult: adenocarcinoma of pancreas  History of present illness:  the patient initially presented to the ED on 10/20/2022 with several weeks of decreased appetite, nausea, difficulty swallowing, and weight loss of over 50 pounds in 2 months. Based on labs and physical exam at the time of visit, she was treated for UTI with antibiotics. When symptoms did not resolve, she did see her PCP who ordered a CT scan of her chest, abdomen, and pelvis. Scan showed: 1. 4.8 x 3.2 cm pancreatic head mass with associated marked atrophy and pancreatic ductal dilatation involving the neck, body and tail of the pancreas. This is compatible with a primary pancreatic adenocarcinoma and is encasing the proximal left gastric, common hepatic and splenic arteries. 2. 5.7 x 5.4 cm solid lung nodule in the right upper lobe. This is nonspecific and can be reassessed on follow-up imaging. A solitary metastasis is possibility. 3. Single small mesenteric lymph node superior to the head of the pancreas with a density similar to the pancreatic head mass, suspicious for a small metastatic node. 4. Large hiatal hernia containing the entire stomach. 5. Cholelithiasis. 6. Colonic diverticulosis. 7. Small umbilical hernia containing fat and small to moderate-sized bilateral inguinal hernias containing fat. 8.  Calcific coronary artery and aortic  atherosclerosis. She went on to have endoscopic ultrasound which showed a mass in the pancreatic head which was suspicious for pancreatic adenocarcinoma in appearance. Fine needle biopsy was performed. Cytology results confirmed diagnosis of pancreatic adenocarcinoma. It was staged as T3,N0,Mx by endosonographic criteria. Full visualization was unable to be obtained due to anatomy of the large hiatal hernia. Ca 19.9 was performed on 11/18/2022 with result of 429.  Today, she presents to the clinic with her husband and daughter. She remains very fatigued. She states she has very little interest in food. She states that she is just not getting hungry. She does not feel thirsty. She states that she is having a difficult time with swallowing. When she is able to get anything down, she feels like gagging. She denies abdominal pain. She states that she does have constipation, but also not having much intake. Denies diarrhea. Denies feeling depressed or anxious about the diagnosis. She denies chest pain, chest pressure, or shortness of breath. She states that she has been relatively healthy up until last few months. She does have history of breast cancer, Stage 1. She underwent lumpectomy, but did not require chemotherapy or radiation.  This diagnosis was in 2020 and she continues to take anastrozole daily.   MEDICAL HISTORY:  Past Medical History:  Diagnosis Date   Anxiety    Arthritis    Breast cancer (HCC)    Depression    Family history of breast cancer    Family history of lung cancer    Family history of pancreatic cancer    Family history of throat cancer    Headache    Hypertension    Hypothyroidism     SURGICAL HISTORY:  Past Surgical History:  Procedure Laterality Date   ABDOMINAL HYSTERECTOMY     ADENOIDECTOMY     BIOPSY  11/18/2022   Procedure: BIOPSY;  Surgeon: Meridee Score Netty Starring., MD;  Location: [facility];  Service: Gastroenterology;;   BREAST LUMPECTOMY WITH RADIOACTIVE SEED AND  SENTINEL LYMPH NODE BIOPSY Left 06/27/2018   Procedure: LEFT BREAST LUMPECTOMY WITH RADIOACTIVE SEED AND LEFT AXILLARY SENTINEL LYMPH NODE BIOPSY;  Surgeon: Emelia Loron, MD;  Location: [facility];  Service: General;  Laterality: Left;   BREAST SURGERY     cyst removed   ESOPHAGOGASTRODUODENOSCOPY (EGD) WITH PROPOFOL N/A 11/18/2022   Procedure: ESOPHAGOGASTRODUODENOSCOPY (EGD) WITH PROPOFOL;  Surgeon: Lemar Lofty., MD;  Location: [facility];  Service: Gastroenterology;  Laterality: N/A;   FINE NEEDLE ASPIRATION N/A 11/18/2022   Procedure: FINE NEEDLE ASPIRATION (FNA) LINEAR;  Surgeon: Lemar Lofty., MD;  Location: [facility];  Service: Gastroenterology;  Laterality: N/A;   PARTIAL KNEE ARTHROPLASTY Right 12/08/2015   Procedure: UNICOMPARTMENTAL KNEE;  Surgeon: Dannielle Huh, MD;  Location: [facility];  Service: Orthopedics;  Laterality: Right;   TONSILLECTOMY     UPPER ESOPHAGEAL ENDOSCOPIC ULTRASOUND (EUS) N/A 11/18/2022   Procedure: UPPER ESOPHAGEAL ENDOSCOPIC ULTRASOUND (EUS);  Surgeon: Lemar Lofty., MD;  Location: [facility];  Service: Gastroenterology;  Laterality: N/A;    SOCIAL HISTORY: Social History   Socioeconomic History   Marital status: Married    Spouse name: Not on file   Number of children: Not on file   Years of education: Not on file   Highest education level: Not on file  Occupational History   Not on file  Tobacco Use   Smoking status: Never   Smokeless tobacco: Never  Vaping Use   Vaping status: Never Used  Substance and Sexual Activity   Alcohol use: Yes    Alcohol/week: 0.0 standard drinks of alcohol    Comment: occas.   Drug use: No   Sexual activity: Not on file  Other Topics Concern   Not on file  Social History Narrative   Not on file   Social Determinants of Health   Financial Resource Strain: Not on file  Food Insecurity: Not on file  Transportation Needs: Not on file  Physical Activity: Not on  file  Stress: Not on file  Social Connections: Not on file  Intimate Partner Violence: Not on file    FAMILY HISTORY: Family History  Problem Relation Age of Onset   Lung cancer Father    Breast cancer Maternal Aunt    Throat cancer Maternal Uncle    Pancreatic cancer Maternal Grandmother     ALLERGIES:  is allergic to propoxyphene.  MEDICATIONS:  Current Outpatient Medications  Medication Sig Dispense Refill   acetaminophen (TYLENOL) 500 MG tablet Take 1,000 mg by mouth every 6 (six) hours as needed (for pain.).     anastrozole (ARIMIDEX) 1 MG tablet TTAKE 1 TABLET BY MOUTH DAILY 90 tablet 3   diphenhydrAMINE (BENADRYL) 25 MG tablet Take 25 mg by mouth every 6 (six) hours as needed.     dronabinol (MARINOL) 2.5 MG capsule Take 1 capsule (2.5 mg total) by mouth 2 (two) times daily before a meal. 30 capsule 0   losartan (COZAAR) 100 MG tablet Take 100 mg by mouth daily.      meclizine (ANTIVERT) 25 MG tablet Take 25 mg by mouth 3 (three) times daily as needed for dizziness.     mirtazapine (REMERON SOL-TAB) 15 MG disintegrating tablet Take 15  mg by mouth at bedtime.     ondansetron (ZOFRAN-ODT) 4 MG disintegrating tablet Take 1-2 tablets (4-8 mg total) by mouth every 8 (eight) hours as needed for nausea or vomiting. 45 tablet 0   pantoprazole (PROTONIX) 40 MG tablet Take 1 tablet (40 mg total) by mouth daily. 30 tablet 6   SYNTHROID 175 MCG tablet      azelastine (ASTELIN) 0.1 % nasal spray 1-2 sprays in each nostril 1-2 times per day (Patient not taking: Reported on 11/24/2022) 30 mL 5   Melatonin 10 MG CAPS Take 10 mg by mouth at bedtime as needed (for sleep.). (Patient not taking: Reported on 11/24/2022)     montelukast (SINGULAIR) 10 MG tablet 1 tablet 1 time per day daily (Patient not taking: Reported on 11/24/2022) 30 tablet 5   No current facility-administered medications for this visit.    REVIEW OF SYSTEMS:   Constitutional: Denies fevers, chills or abnormal night sweats.  She has had a 60 pound weight loss over the last 2 to 3 months.,  Eyes: Denies blurriness of vision, double vision or watery eyes Ears, nose, mouth, throat, and face: Denies mucositis or sore throat Respiratory: Denies cough, dyspnea or wheezes Cardiovascular: Denies palpitation, chest discomfort or lower extremity swelling Gastrointestinal: reports difficulty swallowing, gagging, nausea, and early satiety.  Skin: Denies abnormal skin rashes Lymphatics: Denies new lymphadenopathy or easy bruising Neurological:Denies numbness, tingling or new weaknesses Behavioral/Psych: Mood is stable, no new changes. She reports lack of appetite and disinterest in eating or drinking.  All other systems were reviewed with the patient and are negative.  PHYSICAL EXAMINATION: ECOG PERFORMANCE STATUS: 2 - Symptomatic, <50% confined to bed  Vitals:   11/24/22 1102  BP: 138/87  Pulse: 96  Resp: 18  Temp: 97.9 F (36.6 C)  SpO2: 97%   Filed Weights   11/24/22 1102  Weight: 162 lb (73.5 kg)    GENERAL:alert, no distress and comfortable. Slightly flat affect.  SKIN: skin color, texture, turgor are normal, no rashes or significant lesions EYES: normal, conjunctiva are pink and non-injected, sclera clear OROPHARYNX:no exudate, no erythema and lips, buccal mucosa, and tongue normal  NECK: supple, thyroid normal size, non-tender, without nodularity LYMPH:  no palpable lymphadenopathy in the cervical, axillary or inguinal LUNGS: clear to auscultation and percussion with normal breathing effort HEART: regular rate & rhythm and no murmurs and no lower extremity edema ABDOMEN:abdomen soft, non-tender and normal bowel sounds Musculoskeletal:no cyanosis of digits and no clubbing  PSYCH: alert & oriented x 3 with fluent speech NEURO: no focal motor/sensory deficits  LABORATORY DATA:  I have reviewed the data as listed Lab Results  Component Value Date   WBC 6.6 10/20/2022   HGB 14.8 10/20/2022   HCT 43.7  10/20/2022   MCV 100.0 10/20/2022   PLT 183 10/20/2022   Recent Labs    10/20/22 1357 11/18/22 1044  NA 138 136  K 4.2 3.0*  CL 100 93*  CO2 27 27  GLUCOSE 99 110*  BUN 22 18  CREATININE 0.97 0.95  CALCIUM 9.3 8.9  GFRNONAA 59* >60  PROT 7.2 6.3*  ALBUMIN 4.0 3.4*  AST 23 40  ALT 11 28  ALKPHOS 74 109  BILITOT 0.8 1.2    RADIOGRAPHIC STUDIES: I have personally reviewed the radiological images as listed and agreed with the findings in the report. CT CHEST ABDOMEN PELVIS W CONTRAST  Addendum Date: 11/08/2022   ADDENDUM REPORT: 11/08/2022 10:59 ADDENDUM: There is a typo in the  original report. The right upper lobe solid pulmonary nodule was mistakenly described as 5.7 x 5.4 cm when in fact the measurement should read 5.7 x 5.4 mm. This measurement can be appropriately rounded and characterized as 0.6 x 0.5 cm. Additionally, there is a small solid pulmonary nodule in the left upper lobe which measures 0.4 cm on image 43 of series 5. This nodule is also concerning in the setting of the known mass in the pancreatic head. Electronically Signed   By: Malachy Moan M.D.   On: 11/08/2022 10:59   Result Date: 11/08/2022 CLINICAL DATA:  Generalized malaise, nausea and early satiety for the past 2-3 days. History of breast cancer with lumpectomy. Previous hysterectomy. EXAM: CT CHEST, ABDOMEN, AND PELVIS WITH CONTRAST TECHNIQUE: Multidetector CT imaging of the chest, abdomen and pelvis was performed following the standard protocol during bolus administration of intravenous contrast. RADIATION DOSE REDUCTION: This exam was performed according to the departmental dose-optimization program which includes automated exposure control, adjustment of the mA and/or kV according to patient size and/or use of iterative reconstruction technique. CONTRAST:  ISOVUE-300 IOPAMIDOL (ISOVUE-300) INJECTION 61% COMPARISON:  Chest radiographs dated 09/21/2005. FINDINGS: CT CHEST FINDINGS Cardiovascular:  Atheromatous calcifications, including the coronary arteries and aorta. Normal-sized heart. No pericardial effusion. Mediastinum/Nodes: 1.1 cm right lobe thyroid nodule with coarse calcifications. This does not need imaging follow-up. (Ref: J Am Coll Radiol. 2015 Feb;12(2): 143-50). No enlarged lymph nodes. Unremarkable esophagus. Large hiatal hernia containing the entire stomach. Lungs/Pleura: Minimal bilateral dependent atelectasis. 5.7 x 5.4 cm solid lung nodule in the right upper lobe on image number 47/5. No other lung nodules seen. No pleural fluid. Musculoskeletal: Marked right glenohumeral joint degenerative changes. Thoracic and lower cervical spine degenerative changes. No evidence of bony metastatic disease. CT ABDOMEN PELVIS FINDINGS Hepatobiliary: Unremarkable liver. 9.7 mm gallstone in the gallbladder with no gallbladder wall thickening or pericholecystic fluid. Pancreas: Large, heterogeneous, low-density mass in the head of the pancreas measuring 4.8 x 3.2 cm on image number 56/3. Associated marked atrophy and pancreatic ductal dilatation involving the neck, body and tail of the pancreas. The maximum pancreatic duct diameter is 9.0 mm. The pancreatic mass is encasing the proximal celiac axis vessels including the left gastric, common hepatic and splenic arteries. Spleen: Normal in size without focal abnormality. Adrenals/Urinary Tract: Normal-appearing adrenal glands. Large bilateral renal parapelvic cysts. Additional simple cortical cysts on the right. These do not need imaging follow-up. No hydronephrosis. Unremarkable ureters and urinary bladder. Stomach/Bowel: Large hiatal hernia containing the entire stomach. Multiple colonic diverticula without evidence of diverticulitis. Normal-appearing appendix. Unremarkable small bowel. Vascular/Lymphatic: Atheromatous arterial calcifications without aneurysm. Single low-density mesenteric lymph node superior to the head of the pancreas with a short axis  diameter of 5.4 mm on image number 315/2. This has a density similar to the pancreatic head mass and is suspicious for a small metastatic node. No enlarged lymph nodes seen. Reproductive: Status post hysterectomy. No adnexal masses. Other: Small umbilical hernia containing fat and small to moderate-sized bilateral inguinal hernias containing fat. Musculoskeletal: Moderate to marked bilateral hip degenerative changes. Lumbar spine degenerative changes. No evidence of bony metastatic disease. IMPRESSION: 1. 4.8 x 3.2 cm pancreatic head mass with associated marked atrophy and pancreatic ductal dilatation involving the neck, body and tail of the pancreas. This is compatible with a primary pancreatic adenocarcinoma and is encasing the proximal left gastric, common hepatic and splenic arteries. 2. 5.7 x 5.4 cm solid lung nodule in the right upper lobe. This is  nonspecific and can be reassessed on follow-up imaging. A solitary metastasis is possibility. 3. Single small mesenteric lymph node superior to the head of the pancreas with a density similar to the pancreatic head mass, suspicious for a small metastatic node. 4. Large hiatal hernia containing the entire stomach. 5. Cholelithiasis. 6. Colonic diverticulosis. 7. Small umbilical hernia containing fat and small to moderate-sized bilateral inguinal hernias containing fat. 8.  Calcific coronary artery and aortic atherosclerosis. These results will be called to the ordering clinician or representative by the Radiologist Assistant, and communication documented in the PACS or [employer]. Aortic Atherosclerosis (ICD10-I70.0). Electronically Signed: By: Beckie Salts M.D. On: 11/03/2022 12:06    ASSESSMENT & PLAN:  Adenocarcinoma of pancreas [facility]) Assessment & Plan: Reviewed medical records, imaging studies, and labs with the patient and her family members.  -PET scan ordered.  -genetic testing done today -return to clinic in 2 to 3 weeks to review results and  discuss treatment options.   Orders: -     droNABinol; Take 1 capsule (2.5 mg total) by mouth 2 (two) times daily before a meal.  Dispense: 30 capsule; Refill: 0 -     Ambulatory Referral to [facility] -     NM PET Image Initial (PI) Skull Base To Thigh (F-18 FDG); Future  Hiatal hernia Assessment & Plan: Some current symptoms may be related to presence of large hiatal hernia which contains the entire stomach. Will discuss further treatment and management in the future.     Abnormal weight loss Assessment & Plan: Patient has had 60 pound weight loss over past 2 to 3 months. Reviewed importance of increasing protein and caloric intake at this time. Two week trial of marinol 2.5 mg. This may be taken twice daily.  An urgent referral to nutrition was made.   Orders: -     droNABinol; Take 1 capsule (2.5 mg total) by mouth 2 (two) times daily before a meal.  Dispense: 30 capsule; Refill: 0 -     Ambulatory Referral to [facility] -     Ondansetron; Take 1-2 tablets (4-8 mg total) by mouth every 8 (eight) hours as needed for nausea or vomiting.  Dispense: 45 tablet; Refill: 0  Nausea without vomiting Assessment & Plan: Zofran ODT 4-8 mg may be taken as needed for nausea  Orders: -     Ondansetron; Take 1-2 tablets (4-8 mg total) by mouth every 8 (eight) hours as needed for nausea or vomiting.  Dispense: 45 tablet; Refill: 0    The patient should follow up in 2 to 3 weeks, after PET scan, to review results and discuss plan for treatment.  All questions were answered. The patient knows to call the clinic with any problems, questions or concerns. Time spent with the patient was approximately 60 minutes. This time included reviewing progress notes, labs, imaging studies, and discussing plan for follow up.      Carlean Jews, NP 11/24/2022 2:15 PM  Addendum I have seen the patient, examined her. I agree with the assessment and and plan and have edited the notes.   77 year old  female with past medical history of hypertension, depression, presented with anorexia, weight loss, and intermittent nausea.  She has lost a total of 50 pounds in the past 2 to 3 months.  I reviewed her CT scan images, US findings and biopsy results with patient and her family in detail.  She has biopsy-proven adenocarcinoma in the head of pancreas, also has a 5  mm nodule in the right lung, indeterminate, but concerning for metastatic disease.  Will obtain a PET scan for further evaluation.  Her primary tumor measuring 4.8 cm, which is encasing the proximal left gastric, common hepatic and splenic arteries, may not be resectable, or at least borderline resectable.  Will review her case in our GI tumor board.  We discussed the role of of surgery, chemotherapy, and radiation, she is open to treatment.  Due to her significant weight loss and anorexia, she is not a great candidate for Whipple surgery and intensive chemotherapy.  I plan to see her back in 2 weeks after PET scan to finalize her treatment plan.  We discussed symptom management, and will make a urgent referral for dietitian.  All questions were answered.  I spent a total of45  minutes for her visit today, more than 50% time on face-to-face counseling.  Malachy Mood MD 11/24/2022

## 2022-11-24 NOTE — Assessment & Plan Note (Signed)
Zofran ODT 4-8 mg may be taken as needed for nausea

## 2022-11-24 NOTE — Assessment & Plan Note (Addendum)
Reviewed medical records, imaging studies, and labs with the patient and her family members.  -PET scan ordered.  -genetic testing done today -return to clinic in 2 to 3 weeks to review results and discuss treatment options.

## 2022-11-24 NOTE — Progress Notes (Signed)
I met with Ms Sabree, her husband, and daughter after her consultation with Vincent Gros, DNP and Dr Mosetta Putt.  I explained my role as a nurse navigator and provided my contact information.  I explained the services provided at [facility] and provided written information.  I briefly explained insertion and care of a port a cath.  I showed a sample of the port a cath.  All questions were answered.  She verbalized understanding.

## 2022-11-24 NOTE — Assessment & Plan Note (Addendum)
Patient has had 60 pound weight loss over past 2 to 3 months. Reviewed importance of increasing protein and caloric intake at this time. Two week trial of marinol 2.5 mg. This may be taken twice daily.  An urgent referral to nutrition was made.

## 2022-11-24 NOTE — Assessment & Plan Note (Signed)
Some current symptoms may be related to presence of large hiatal hernia which contains the entire stomach. Will discuss further treatment and management in the future.

## 2022-11-25 ENCOUNTER — Telehealth: Payer: Self-pay | Admitting: Nurse Practitioner

## 2022-11-25 ENCOUNTER — Inpatient Hospital Stay: Payer: Medicare PPO | Admitting: Licensed Clinical Social Worker

## 2022-11-25 ENCOUNTER — Other Ambulatory Visit: Payer: Self-pay | Admitting: Nurse Practitioner

## 2022-11-25 ENCOUNTER — Other Ambulatory Visit (HOSPITAL_COMMUNITY): Payer: Self-pay

## 2022-11-25 DIAGNOSIS — C259 Malignant neoplasm of pancreas, unspecified: Secondary | ICD-10-CM

## 2022-11-25 DIAGNOSIS — R634 Abnormal weight loss: Secondary | ICD-10-CM

## 2022-11-25 MED ORDER — DRONABINOL 2.5 MG PO CAPS
2.5000 mg | ORAL_CAPSULE | Freq: Two times a day (BID) | ORAL | 0 refills | Status: DC
  Filled 2022-11-25: qty 30, 15d supply, fill #0

## 2022-11-25 NOTE — Progress Notes (Signed)
[facility] Clinical Social Work  Initial Assessment   Erika Huynh is a 77 y.o. year old female contacted by phone. Clinical Social Work was referred by medical provider for assessment of psychosocial needs.   SDOH (Social Determinants of Health) assessments performed: Yes   SDOH Screenings   Tobacco Use: Low Risk  (11/18/2022)     Distress Screen completed: Yes    05/08/2018   10:41 AM  ONCBCN DISTRESS SCREENING  Screening Type Initial Screening  Distress experienced in past week (1-10) 1  Emotional problem type Depression      Family/Social Information:  Housing Arrangement: patient lives with her husband [place] .  Both pt and her husband are independent in ADLs, utilizing no assistive devices for ambulation. Family members/support persons in your life? Pt has limited support locally.  Pt has one child living in [place] and another in [place] who can offer limited support as needed.  Transportation concerns: no  Employment: Retired .  Income source: Actor concerns: No Type of concern: None Food access concerns: no Religious or spiritual practice: Not known Services Currently in place:  none  Coping/ Adjustment to diagnosis: Patient understands treatment plan and what happens next? Treatment to be determined following PET scan. Concerns about diagnosis and/or treatment: Quality of life Patient reported stressors:  no stressors reported at this time. Hopes and/or priorities: Pt's priority is to complete diagnostics and begin treatment w/ the hope of positive results Patient enjoys  not discussed Current coping skills/ strengths: Capable of independent living , Motivation for treatment/growth , Physical Health , and Supportive family/friends     SUMMARY: Current SDOH Barriers:  No barriers identified at this time.   Clinical Social Work Clinical Goal(s):  No clinical social work goals at this time  Interventions: Discussed common feeling and  emotions when being diagnosed with cancer, and the importance of support during treatment Informed patient of the support team roles and support services at [facility] Provided CSW contact information and encouraged patient to call with any questions or concerns    Follow Up Plan: Patient will contact CSW with any support or resource needs Patient verbalizes understanding of plan: Yes    Rachel Moulds, LCSW Clinical Social Worker [facility]

## 2022-11-26 ENCOUNTER — Encounter: Payer: Self-pay | Admitting: Nurse Practitioner

## 2022-11-26 ENCOUNTER — Other Ambulatory Visit (HOSPITAL_COMMUNITY): Payer: Self-pay

## 2022-11-28 ENCOUNTER — Other Ambulatory Visit: Payer: Self-pay | Admitting: Hematology

## 2022-11-28 MED ORDER — MEGESTROL ACETATE 625 MG/5ML PO SUSP: 625.0000 mg | Freq: Every day | ORAL | 0 refills | Status: DC

## 2022-11-29 ENCOUNTER — Inpatient Hospital Stay: Payer: Medicare PPO | Admitting: Dietician

## 2022-11-29 ENCOUNTER — Telehealth: Payer: Self-pay | Admitting: Hematology

## 2022-11-29 ENCOUNTER — Telehealth: Payer: Self-pay | Admitting: Dietician

## 2022-11-29 ENCOUNTER — Other Ambulatory Visit: Payer: Self-pay

## 2022-11-29 DIAGNOSIS — C259 Malignant neoplasm of pancreas, unspecified: Secondary | ICD-10-CM

## 2022-11-29 NOTE — Progress Notes (Signed)
I spoke with Ms Cannan and reviewed PET scan appt, date, time, location and instructions: 10/22 arrive at 1500 at [facility], nps except sips of water after 0930, no carbohydrates for breakfast.  All questions were answered.  She verbalized understanding.

## 2022-11-29 NOTE — Telephone Encounter (Signed)
Inbasket message sent to the [facility] scheduling team to get the patient scheduled.

## 2022-11-29 NOTE — Telephone Encounter (Signed)
Nutrition Assessment   Reason for Assessment: Urgent referral per Dr. Mosetta Putt   ASSESSMENT: 77 year old female with newly diagnosed pancreatic cancer. Treatment plan under work-up at this time. PET scheduled 10/22. Patient is under the care of Dr. Mosetta Putt  Past medical history includes large hiatal hernia, HTN, gastritis, osteoarthritis, breast cancer (2020)  Spoke with patient via telephone. Patient appreciative of call and states she is doing fine. Patient endorses having little to no appetite in the last week. Says foods make her gag. She is tolerating small amounts of soups, broth, Ensure. Unable to drink all of Ensure as it is filling. Patient denies abdominal pain, nausea, vomiting, diarrhea. She is mildly constipated.   Nutrition Focused Physical Exam: unable to complete (telephone visit)  Medications: arimidex, cozaar, antivert, megace, remeron, singulair, zofran, protonix, synthroid   Labs: 10/3 - K 3.0, glucose 110, albumin 3.4   Anthropometrics:   Height: 5'6" Weight: 162 lb  UBW: 185 lb 3.2 oz (09/24/22) BMI: 26.15   NUTRITION DIAGNOSIS: Unintended wt loss related to cancer as evidenced by 12% (23 lb) wt loss in 2 months - severe for time frame   MALNUTRITION DIAGNOSIS: Given reported anorexia and wt loss, highly suspect degree of malnutrition. However, unable to complete NFPE at this time to assess fat/muscle depletion   INTERVENTION:  Discussed strategies for increasing calories and protein with small frequent meals/snacks q2h Educated on soft smooth textures for ease of intake  Continue Ensure Plus/equivalent - recommend 3-4/day with poor appetite Megace called in per MD for appetite Will mail soft moist high protein foods, snacks ideas, shake recipes per pt request   MONITORING, EVALUATION, GOAL: Patient will tolerate increased calories and protein to minimize further wt loss   Next Visit: To be scheduled in collaboration with upcoming [facility]  appointments

## 2022-12-01 ENCOUNTER — Other Ambulatory Visit: Payer: Self-pay

## 2022-12-01 NOTE — Progress Notes (Signed)
The proposed treatment discussed in conference is for discussion purpose only and is not a binding recommendation.  The patients have not been physically examined, or presented with their treatment options.  Therefore, final treatment plans cannot be decided.

## 2022-12-02 ENCOUNTER — Other Ambulatory Visit (HOSPITAL_COMMUNITY): Payer: Self-pay

## 2022-12-03 ENCOUNTER — Telehealth: Payer: Self-pay | Admitting: Hematology

## 2022-12-03 NOTE — Telephone Encounter (Signed)
 Patient has currently declined on scheduling appointment for [facility], patient is aware they can call back at anytime to reschedule appointment when ready

## 2022-12-07 ENCOUNTER — Ambulatory Visit (HOSPITAL_COMMUNITY)
Admission: RE | Admit: 2022-12-07 | Discharge: 2022-12-07 | Disposition: A | Discharge status: Home / Self Care | Payer: Medicare PPO | Source: Ambulatory Visit | Attending: Nurse Practitioner | Admitting: Nurse Practitioner

## 2022-12-07 DIAGNOSIS — R911 Solitary pulmonary nodule: Secondary | ICD-10-CM | POA: Diagnosis not present

## 2022-12-07 DIAGNOSIS — C259 Malignant neoplasm of pancreas, unspecified: Secondary | ICD-10-CM | POA: Diagnosis not present

## 2022-12-07 LAB — GLUCOSE, CAPILLARY: Glucose-Capillary: 98 mg/dL (ref 70–99)

## 2022-12-07 MED ORDER — FLUDEOXYGLUCOSE F - 18 (FDG) INJECTION
8.1000 | Freq: Once | INTRAVENOUS | Status: AC
  Administered 2022-12-07: 8.03 via INTRAVENOUS

## 2022-12-10 ENCOUNTER — Other Ambulatory Visit: Payer: Self-pay | Admitting: Nurse Practitioner

## 2022-12-10 DIAGNOSIS — C259 Malignant neoplasm of pancreas, unspecified: Secondary | ICD-10-CM

## 2022-12-12 ENCOUNTER — Other Ambulatory Visit: Payer: Self-pay | Admitting: Nurse Practitioner

## 2022-12-12 DIAGNOSIS — R634 Abnormal weight loss: Secondary | ICD-10-CM

## 2022-12-12 DIAGNOSIS — R110 Nausea: Secondary | ICD-10-CM

## 2022-12-13 ENCOUNTER — Other Ambulatory Visit: Payer: Self-pay

## 2022-12-13 MED ORDER — ONDANSETRON 4 MG PO TBDP: 4.0000 mg | ORAL_TABLET | Freq: Three times a day (TID) | ORAL | 0 refills | Status: DC | PRN

## 2022-12-14 ENCOUNTER — Inpatient Hospital Stay: Payer: Medicare PPO | Admitting: Nurse Practitioner

## 2022-12-14 ENCOUNTER — Inpatient Hospital Stay: Payer: Medicare PPO

## 2022-12-14 ENCOUNTER — Telehealth: Payer: Self-pay | Admitting: Nurse Practitioner

## 2022-12-14 NOTE — Assessment & Plan Note (Deleted)
Reviewed medical records, imaging studies, and labs with the patient and her family members.  -PET scan ordered.  -genetic testing done today -return to clinic in 2 to 3 weeks to review results and discuss treatment options.

## 2022-12-14 NOTE — Progress Notes (Signed)
Very hypermetabolic mass in head of pancreas and smaller hypermetabolic mass In liver. Appointment scheduled 12/21/2022 to review.and plan for treatment. Of note, very large hiatal hernia which has stomach almost entirely in left hemithorax.

## 2022-12-14 NOTE — Progress Notes (Deleted)
Patient Care Team: Laurann Montana, MD as PCP - General (Family Medicine) Pershing Proud, RN as Oncology Nurse Navigator Donnelly Angelica, RN as Oncology Nurse Navigator Serena Croissant, MD as Consulting Physician (Hematology and Oncology) Emelia Loron, MD as Consulting Physician (General Surgery) Lonie Peak, MD as Attending Physician (Radiation Oncology) Malachy Mood, MD as Consulting Physician (Hematology and Oncology) Carlean Jews, NP as Nurse Practitioner (Hematology and Oncology)  Clinic Day:  12/14/2022  Referring physician: Laurann Montana, MD  ASSESSMENT & PLAN:   Assessment & Plan: Adenocarcinoma of pancreas [facility]) Reviewed medical records, imaging studies, and labs with the patient and her family members.  -PET scan ordered.  -genetic testing done today -return to clinic in 2 to 3 weeks to review results and discuss treatment options.    The patient understands the plans discussed today and is in agreement with them.  She knows to contact our office if she develops concerns prior to her next appointment.  I provided *** minutes of face-to-face time during this encounter and > 50% was spent counseling as documented under my assessment and plan.    Carlean Jews, NP  [facility] [address] [place] [address] Dept: [phone] Dept Fax: [phone]   No orders of the defined types were placed in this encounter.     CHIEF COMPLAINT:  CC: adenocarcinoma of pancreas  Current Treatment:  ***  INTERVAL HISTORY:  Amariah is here today for repeat clinical assessment. She denies fevers or chills. She denies pain. Her appetite is good. Her weight {Weight change:10426}.  I have reviewed the past medical history, past surgical history, social history and family history with the patient and they are unchanged from previous note.  ALLERGIES:  is allergic to  propoxyphene.  MEDICATIONS:  Current Outpatient Medications  Medication Sig Dispense Refill   acetaminophen (TYLENOL) 500 MG tablet Take 1,000 mg by mouth every 6 (six) hours as needed (for pain.).     anastrozole (ARIMIDEX) 1 MG tablet TTAKE 1 TABLET BY MOUTH DAILY 90 tablet 3   azelastine (ASTELIN) 0.1 % nasal spray 1-2 sprays in each nostril 1-2 times per day (Patient not taking: Reported on 11/24/2022) 30 mL 5   diphenhydrAMINE (BENADRYL) 25 MG tablet Take 25 mg by mouth every 6 (six) hours as needed.     losartan (COZAAR) 100 MG tablet Take 100 mg by mouth daily.      meclizine (ANTIVERT) 25 MG tablet Take 25 mg by mouth 3 (three) times daily as needed for dizziness.     megestrol (MEGACE ES) 625 MG/5ML suspension Take 5 mLs (625 mg total) by mouth daily. 150 mL 0   Melatonin 10 MG CAPS Take 10 mg by mouth at bedtime as needed (for sleep.). (Patient not taking: Reported on 11/24/2022)     mirtazapine (REMERON SOL-TAB) 15 MG disintegrating tablet Take 15 mg by mouth at bedtime.     montelukast (SINGULAIR) 10 MG tablet 1 tablet 1 time per day daily (Patient not taking: Reported on 11/24/2022) 30 tablet 5   ondansetron (ZOFRAN-ODT) 4 MG disintegrating tablet Take 1-2 tablets (4-8 mg total) by mouth every 8 (eight) hours as needed for nausea or vomiting. 45 tablet 0   pantoprazole (PROTONIX) 40 MG tablet Take 1 tablet (40 mg total) by mouth daily. 30 tablet 6   SYNTHROID 175 MCG tablet      No current facility-administered medications for this visit.  HISTORY OF PRESENT ILLNESS:   Oncology History  Malignant neoplasm of upper-outer quadrant of left breast in female, estrogen receptor positive (HCC)  04/17/2018 Initial Diagnosis   Screening detected left breast asymmetry 0.5 cm by mammogram, ultrasound revealed 0.6 cm mass at 3 o'clock position axilla negative, biopsy revealed grade 2 ILC, ER 90%, PR 90%, Ki-67 1%, HER-2 -1+, T1b N0 stage Ia clinical stage   04/26/2018 Cancer Staging    Staging form: Breast, AJCC 8th Edition - Clinical stage from 04/26/2018: Stage IA (cT1b, cN0, cM0, G2, ER+, PR+, HER2-) - Signed by Serena Croissant, MD on 04/26/2018   04/26/2018 Genetic Testing   Negative.  Genes tested include: ATM, BRCA1, BRCA2, CDH1, CHEK2, PALB2, PTEN, STK11 and TP53.  APC, ATM, AXIN2, BARD1, BMPR1A, BRCA1, BRCA2, BRIP1, CDH1, CDKN2A (p14ARF), CDKN2A (p16INK4a), CKD4, CHEK2, CTNNA1, DICER1, EPCAM (Deletion/duplication testing only), GREM1 (promoter region deletion/duplication testing only), KIT, MEN1, MLH1, MSH2, MSH3, MSH6, MUTYH, NBN, NF1, NHTL1, PALB2, PDGFRA, PMS2, POLD1, POLE, PTEN, RAD50, RAD51C, RAD51D, SDHB, SDHC, SDHD, SMAD4, SMARCA4. STK11, TP53, TSC1, TSC2, and VHL.  The following genes were evaluated for sequence changes only: SDHA and HOXB13 c.251G>A variant only.   05/08/2018 -  Anti-estrogen oral therapy   Anastrozole 1 mg daily   06/27/2018 Surgery   Right lumpectomy Dwain Sarna): invasive lobular carcinoma with DCIS, grade 2, 0.8cm, ER+ (90%), PR+ (90%), HER2- (1+), Ki67 1%, clear margins, 0/5 SNL negative for carcinoma.    07/12/2018 Cancer Staging   Staging form: Breast, AJCC 8th Edition - Pathologic: Stage IA (pT1b, pN0, cM0, G2, ER+, PR+, HER2-) - Signed by Loa Socks, NP on 07/12/2018   Adenocarcinoma of pancreas (HCC)  11/08/2022 Imaging   CT Chest, abdomen, and pelvis with contrast  IMPRESSION: 1. 4.8 x 3.2 cm pancreatic head mass with associated marked atrophy and pancreatic ductal dilatation involving the neck, body and tail of the pancreas. This is compatible with a primary pancreatic adenocarcinoma and is encasing the proximal left gastric, common hepatic and splenic arteries. 2. 5.7 x 5.4 cm solid lung nodule in the right upper lobe. This is nonspecific and can be reassessed on follow-up imaging. A solitary metastasis is possibility. 3. Single small mesenteric lymph node superior to the head of the pancreas with a density similar to the  pancreatic head mass, suspicious for a small metastatic node. 4. Large hiatal hernia containing the entire stomach. 5. Cholelithiasis. 6. Colonic diverticulosis. 7. Small umbilical hernia containing fat and small to moderate-sized bilateral inguinal hernias containing fat. 8.  Calcific coronary artery and aortic atherosclerosis.   11/18/2022 Procedure   Endoscopic ultrasound - Dr. Meridee Score  Large, irregular mass of pancreatic head measuring 4.2 X 3.5 cm in diameter. Evidence of mass abutting the portal vein. Consistent with pancreatic adenocarcinoma. Staged at T4,N0,Mx per endosonographic staging criteria.   11/24/2022 Initial Diagnosis   Adenocarcinoma of pancreas (HCC)       REVIEW OF SYSTEMS:   Constitutional: Denies fevers, chills or abnormal weight loss Eyes: Denies blurriness of vision Ears, nose, mouth, throat, and face: Denies mucositis or sore throat Respiratory: Denies cough, dyspnea or wheezes Cardiovascular: Denies palpitation, chest discomfort or lower extremity swelling Gastrointestinal:  Denies nausea, heartburn or change in bowel habits Skin: Denies abnormal skin rashes Lymphatics: Denies new lymphadenopathy or easy bruising Neurological:Denies numbness, tingling or new weaknesses Behavioral/Psych: Mood is stable, no new changes  All other systems were reviewed with the patient and are negative.   VITALS:  There were no vitals taken for this  visit.  Wt Readings from Last 3 Encounters:  11/24/22 162 lb (73.5 kg)  11/18/22 200 lb (90.7 kg)  09/24/22 185 lb 3.2 oz (84 kg)    There is no height or weight on file to calculate BMI.  Performance status (ECOG): {CHL ONC [id number]  PHYSICAL EXAM:   GENERAL:alert, no distress and comfortable SKIN: skin color, texture, turgor are normal, no rashes or significant lesions EYES: normal, Conjunctiva are pink and non-injected, sclera clear OROPHARYNX:no exudate, no erythema and lips, buccal mucosa, and tongue  normal  NECK: supple, thyroid normal size, non-tender, without nodularity LYMPH:  no palpable lymphadenopathy in the cervical, axillary or inguinal LUNGS: clear to auscultation and percussion with normal breathing effort HEART: regular rate & rhythm and no murmurs and no lower extremity edema ABDOMEN:abdomen soft, non-tender and normal bowel sounds Musculoskeletal:no cyanosis of digits and no clubbing  NEURO: alert & oriented x 3 with fluent speech, no focal motor/sensory deficits  LABORATORY DATA:  I have reviewed the data as listed    Component Value Date/Time   NA 136 11/18/2022 1044   K 3.0 (L) 11/18/2022 1044   CL 93 (L) 11/18/2022 1044   CO2 27 11/18/2022 1044   GLUCOSE 110 (H) 11/18/2022 1044   BUN 18 11/18/2022 1044   CREATININE 0.95 11/18/2022 1044   CREATININE 0.83 04/26/2018 0807   CALCIUM 8.9 11/18/2022 1044   PROT 6.3 (L) 11/18/2022 1044   ALBUMIN 3.4 (L) 11/18/2022 1044   AST 40 11/18/2022 1044   AST 13 (L) 04/26/2018 0807   ALT 28 11/18/2022 1044   ALT 13 04/26/2018 0807   ALKPHOS 109 11/18/2022 1044   BILITOT 1.2 11/18/2022 1044   BILITOT 0.4 04/26/2018 0807   GFRNONAA >60 11/18/2022 1044   GFRNONAA >60 04/26/2018 0807   GFRAA >60 04/26/2018 0807    No results found for: "SPEP", "UPEP"  Lab Results  Component Value Date   WBC 6.6 10/20/2022   NEUTROABS 3.0 04/26/2018   HGB 14.8 10/20/2022   HCT 43.7 10/20/2022   MCV 100.0 10/20/2022   PLT 183 10/20/2022      Chemistry      Component Value Date/Time   NA 136 11/18/2022 1044   K 3.0 (L) 11/18/2022 1044   CL 93 (L) 11/18/2022 1044   CO2 27 11/18/2022 1044   BUN 18 11/18/2022 1044   CREATININE 0.95 11/18/2022 1044   CREATININE 0.83 04/26/2018 0807      Component Value Date/Time   CALCIUM 8.9 11/18/2022 1044   ALKPHOS 109 11/18/2022 1044   AST 40 11/18/2022 1044   AST 13 (L) 04/26/2018 0807   ALT 28 11/18/2022 1044   ALT 13 04/26/2018 0807   BILITOT 1.2 11/18/2022 1044   BILITOT 0.4  04/26/2018 0807       RADIOGRAPHIC STUDIES: I have personally reviewed the radiological images as listed and agreed with the findings in the report. No results found.

## 2022-12-20 NOTE — Progress Notes (Addendum)
Patient Care Team: Laurann Montana, MD as PCP - General (Family Medicine) Pershing Proud, RN as Oncology Nurse Navigator Donnelly Angelica, RN as Oncology Nurse Navigator Serena Croissant, MD as Consulting Physician (Hematology and Oncology) Emelia Loron, MD as Consulting Physician (General Surgery) Lonie Peak, MD as Attending Physician (Radiation Oncology) Malachy Mood, MD as Consulting Physician (Hematology and Oncology) Carlean Jews, NP as Nurse Practitioner (Hematology and Oncology)  Clinic Day:  12/21/2022  Referring physician: Laurann Montana, MD  ASSESSMENT & PLAN:   Assessment & Plan: Adenocarcinoma of pancreas [facility]) Reviewed medical records, imaging studies, and labs with the patient and her family members.  -PET scan done 12/14/2022 showed 1. Intensely hypermetabolic pancreatic head mass consistent with primary pancreatic adenocarcinoma. 2. Single hypermetabolic focus in the posterior RIGHT hepatic lobe is concerning for hepatic metastasis.3. No evidence of metastatic adenopathy in the porta hepatis.4. No evidence of pulmonary metastasis. No metabolic activity of RIGHT upper lobe pulmonary nodule.5. Large hiatal hernia with near entirety of the stomach in the LEFT hemithorax.  -genetic testing done    Plan:  Labs reviewed  -CBC showing WBC 7.9; Hgb 15.1; Hct 41.2; Plt 311; Anc 5.0 -CMP - K 3.2; glucose 124; BUN 20; Creatinine 1.14; eGFR 49; Ca 9.0; LFTs normal.   Results of PET scan discussed at length with patient and her family. There is small metastatic lesion in right lobe of the liver along with primary mass of the pancreatic head. Chemotherapy is recommended treatment, however, patient has been unable to eat for weeks. She is very weak. Low performance status. In fact, she missed original appointment last week as family could not get her up and out of the house. The patient feels very fatigued. We discussed need to increase nutrition, especially protein, in order to  tolerate chemotherapy well. She has been taking mirtazapine and megace without improvement in appetite. Will do trial of marinol to improve appetite. Recommend she try to consume at least 3 to 4 bottles of ensure, broth, soft foods, and increased fluids for the next week. Hospice referral was offered, but she and family decline this referral right now.  Schedule a phone visit in one week. If patient is able to tolerate PO nutrition, will offer single agent gemcitabine chemotherapy.  Patient and family will further discuss recommendation and referral for hospice and will let us know if they opt for this. They understand they can notify us of this decision at any time.  The patient understands the plans discussed today and is in agreement with them.  She knows to contact our office if she develops concerns prior to her next appointment.  I provided 40 minutes of face-to-face time during this encounter and > 50% was spent counseling as documented under my assessment and plan.    Carlean Jews, NP  [facility] - A DEPT OF [facility] [address] [place] [address] Dept: [phone] Dept Fax: [phone]   No orders of the defined types were placed in this encounter.     CHIEF COMPLAINT:  CC: pancreatic adenocarcinoma   Current Treatment:  TBD   INTERVAL HISTORY:  Erika Huynh is here today for repeat clinical assessment. She has had PET scan showing pancreatic adenocarcinoma along with small lesion in right lobe of the liver. She is having a great deal of trouble with appetite. Unable to tolerate much of anything by mouth. She has lost 44 pounds since the beginning of  October. Per family members, she has lost approximately 70 pounds in past 2 months. Currently, too weak to do treatment with single agent gemcitabine. Currently declining referral for home hospice care. In office, decision made to attempt improvement in  nutrition over the next week and reassess PS via phone visit. She denies fevers or chills. She denies pain. Her appetite is poor. Her weight has decreased 44 pounds over last month .  I have reviewed the past medical history, past surgical history, social history and family history with the patient and they are unchanged from previous note.  ALLERGIES:  is allergic to propoxyphene.  MEDICATIONS:  Current Outpatient Medications  Medication Sig Dispense Refill   acetaminophen (TYLENOL) 500 MG tablet Take 1,000 mg by mouth every 6 (six) hours as needed (for pain.).     anastrozole (ARIMIDEX) 1 MG tablet TTAKE 1 TABLET BY MOUTH DAILY 90 tablet 3   levothyroxine (SYNTHROID) 125 MCG tablet 175 mcg.     megestrol (MEGACE ES) 625 MG/5ML suspension Take 5 mLs (625 mg total) by mouth daily. 150 mL 0   mirtazapine (REMERON SOL-TAB) 15 MG disintegrating tablet Take 15 mg by mouth at bedtime.     pantoprazole (PROTONIX) 40 MG tablet Take 1 tablet (40 mg total) by mouth daily. 30 tablet 6   azelastine (ASTELIN) 0.1 % nasal spray 1-2 sprays in each nostril 1-2 times per day (Patient not taking: Reported on 11/24/2022) 30 mL 5   diphenhydrAMINE (BENADRYL) 25 MG tablet Take 25 mg by mouth every 6 (six) hours as needed. (Patient not taking: Reported on 12/21/2022)     losartan (COZAAR) 100 MG tablet Take 100 mg by mouth daily.  (Patient not taking: Reported on 12/21/2022)     meclizine (ANTIVERT) 25 MG tablet Take 25 mg by mouth 3 (three) times daily as needed for dizziness. (Patient not taking: Reported on 12/21/2022)     Melatonin 10 MG CAPS Take 10 mg by mouth at bedtime as needed (for sleep.). (Patient not taking: Reported on 11/24/2022)     montelukast (SINGULAIR) 10 MG tablet 1 tablet 1 time per day daily (Patient not taking: Reported on 11/24/2022) 30 tablet 5   ondansetron (ZOFRAN-ODT) 4 MG disintegrating tablet Take 1-2 tablets (4-8 mg total) by mouth every 8 (eight) hours as needed for nausea or vomiting.  (Patient not taking: Reported on 12/21/2022) 45 tablet 0   No current facility-administered medications for this visit.    HISTORY OF PRESENT ILLNESS:   Oncology History  Malignant neoplasm of upper-outer quadrant of left breast in female, estrogen receptor positive (HCC)  04/17/2018 Initial Diagnosis   Screening detected left breast asymmetry 0.5 cm by mammogram, ultrasound revealed 0.6 cm mass at 3 o'clock position axilla negative, biopsy revealed grade 2 ILC, ER 90%, PR 90%, Ki-67 1%, HER-2 -1+, T1b N0 stage Ia clinical stage   04/26/2018 Cancer Staging   Staging form: Breast, AJCC 8th Edition - Clinical stage from 04/26/2018: Stage IA (cT1b, cN0, cM0, G2, ER+, PR+, HER2-) - Signed by Serena Croissant, MD on 04/26/2018   04/26/2018 Genetic Testing   Negative.  Genes tested include: ATM, BRCA1, BRCA2, CDH1, CHEK2, PALB2, PTEN, STK11 and TP53.  APC, ATM, AXIN2, BARD1, BMPR1A, BRCA1, BRCA2, BRIP1, CDH1, CDKN2A (p14ARF), CDKN2A (p16INK4a), CKD4, CHEK2, CTNNA1, DICER1, EPCAM (Deletion/duplication testing only), GREM1 (promoter region deletion/duplication testing only), KIT, MEN1, MLH1, MSH2, MSH3, MSH6, MUTYH, NBN, NF1, NHTL1, PALB2, PDGFRA, PMS2, POLD1, POLE, PTEN, RAD50, RAD51C, RAD51D, SDHB, SDHC, SDHD, SMAD4, SMARCA4. STK11, TP53,  TSC1, TSC2, and VHL.  The following genes were evaluated for sequence changes only: SDHA and HOXB13 c.251G>A variant only.   05/08/2018 -  Anti-estrogen oral therapy   Anastrozole 1 mg daily   06/27/2018 Surgery   Right lumpectomy Dwain Sarna): invasive lobular carcinoma with DCIS, grade 2, 0.8cm, ER+ (90%), PR+ (90%), HER2- (1+), Ki67 1%, clear margins, 0/5 SNL negative for carcinoma.    07/12/2018 Cancer Staging   Staging form: Breast, AJCC 8th Edition - Pathologic: Stage IA (pT1b, pN0, cM0, G2, ER+, PR+, HER2-) - Signed by Loa Socks, NP on 07/12/2018   Adenocarcinoma of pancreas (HCC)  11/08/2022 Imaging   CT Chest, abdomen, and pelvis with contrast   IMPRESSION: 1. 4.8 x 3.2 cm pancreatic head mass with associated marked atrophy and pancreatic ductal dilatation involving the neck, body and tail of the pancreas. This is compatible with a primary pancreatic adenocarcinoma and is encasing the proximal left gastric, common hepatic and splenic arteries. 2. 5.7 x 5.4 cm solid lung nodule in the right upper lobe. This is nonspecific and can be reassessed on follow-up imaging. A solitary metastasis is possibility. 3. Single small mesenteric lymph node superior to the head of the pancreas with a density similar to the pancreatic head mass, suspicious for a small metastatic node. 4. Large hiatal hernia containing the entire stomach. 5. Cholelithiasis. 6. Colonic diverticulosis. 7. Small umbilical hernia containing fat and small to moderate-sized bilateral inguinal hernias containing fat. 8.  Calcific coronary artery and aortic atherosclerosis.   11/18/2022 Procedure   Endoscopic ultrasound - Dr. Meridee Score  Large, irregular mass of pancreatic head measuring 4.2 X 3.5 cm in diameter. Evidence of mass abutting the portal vein. Consistent with pancreatic adenocarcinoma. Staged at T4,N0,Mx per endosonographic staging criteria.   11/24/2022 Initial Diagnosis   Adenocarcinoma of pancreas (HCC)   12/07/2022 PET scan   NM PET scan skull base to thigh IMPRESSION: 1. Intensely hypermetabolic pancreatic head mass consistent with primary pancreatic adenocarcinoma. 2. Single hypermetabolic focus in the posterior RIGHT hepatic lobe is concerning for hepatic metastasis. 3. No evidence of metastatic adenopathy in the porta hepatis. 4. No evidence of pulmonary metastasis. No metabolic activity of RIGHT upper lobe pulmonary nodule. 5. Large hiatal hernia with near entirety of the stomach in the LEFT hemithorax.       REVIEW OF SYSTEMS:   Constitutional: Denies fevers or chills. Has essentially no appetite. Has lost 44 pounds over past month.  Eyes: Denies  blurriness of vision Ears, nose, mouth, throat, and face: Denies mucositis or sore throat Respiratory: Denies cough, dyspnea or wheezes Cardiovascular: Denies palpitation, chest discomfort or lower extremity swelling Gastrointestinal:  Denies nausea, heartburn or change in bowel habits. Reports intermittent abdominal tenderness  Skin: Denies abnormal skin rashes Lymphatics: Denies new lymphadenopathy or easy bruising Neurological:Denies numbness, tingling or new weaknesses Behavioral/Psych: Mood is stable, no new changes. Has little interest in talking to friends or family members. Wants to sleep much of the time.  All other systems were reviewed with the patient and are negative.   VITALS:   Today's Vitals   12/21/22 1300 12/21/22 1329  BP:  94/64  Pulse:  94  Resp:  16  Temp:  98.3 F (36.8 C)  TempSrc:  Oral  SpO2:  98%  Weight:  156 lb (70.8 kg)  PainSc: 2     Body mass index is 25.18 kg/m.   Wt Readings from Last 3 Encounters:  12/21/22 156 lb (70.8 kg)  11/24/22 162 lb (73.5  kg)  11/18/22 200 lb (90.7 kg)    Body mass index is 25.18 kg/m.  Performance status (ECOG): 2 - Symptomatic, <50% confined to bed  PHYSICAL EXAM:   GENERAL:alert, no distress and comfortable. She is fatigued.  SKIN: skin color, texture, turgor are normal, no rashes or significant lesions EYES: normal, Conjunctiva are pink and non-injected, sclera clear OROPHARYNX:no exudate, no erythema and lips, buccal mucosa, and tongue normal  NECK: supple, thyroid normal size, non-tender, without nodularity LYMPH:  no palpable lymphadenopathy in the cervical, axillary or inguinal LUNGS: clear to auscultation and percussion with normal breathing effort HEART: regular rate & rhythm and no murmurs and no lower extremity edema ABDOMEN:abdomen soft with normal bowel movements. She has mild abdominal tenderness with palpation of epigastric area.  Musculoskeletal:no cyanosis of digits and no clubbing  NEURO:  alert & oriented x 3 with fluent speech, no focal motor/sensory deficits   LABORATORY DATA:  I have reviewed the data as listed    Component Value Date/Time   NA 134 (L) 12/21/2022 1251   K 3.2 (L) 12/21/2022 1251   CL 93 (L) 12/21/2022 1251   CO2 30 12/21/2022 1251   GLUCOSE 124 (H) 12/21/2022 1251   BUN 20 12/21/2022 1251   CREATININE 1.14 (H) 12/21/2022 1251   CREATININE 0.83 04/26/2018 0807   CALCIUM 9.0 12/21/2022 1251   PROT 6.5 12/21/2022 1251   ALBUMIN 3.4 (L) 12/21/2022 1251   AST 24 12/21/2022 1251   AST 13 (L) 04/26/2018 0807   ALT 24 12/21/2022 1251   ALT 13 04/26/2018 0807   ALKPHOS 85 12/21/2022 1251   BILITOT 1.1 12/21/2022 1251   BILITOT 0.4 04/26/2018 0807   GFRNONAA 49 (L) 12/21/2022 1251   GFRNONAA >60 04/26/2018 0807   GFRAA >60 04/26/2018 0807   Lab Results  Component Value Date   WBC 7.9 12/21/2022   NEUTROABS 5.0 12/21/2022   HGB 15.1 (H) 12/21/2022   HCT 41.2 12/21/2022   MCV 90.4 12/21/2022   PLT 311 12/21/2022     RADIOGRAPHIC STUDIES: I have personally reviewed the radiological images as listed and agreed with the findings in the report. NM PET Image Initial (PI) Skull Base To Thigh  Result Date: 12/14/2022 CLINICAL DATA:  Subsequent treatment strategy for pancreatic cancer staging. EXAM: NUCLEAR MEDICINE PET SKULL BASE TO THIGH TECHNIQUE: 8.0 mCi F-18 FDG was injected intravenously. Full-ring PET imaging was performed from the skull base to thigh after the radiotracer. CT data was obtained and used for attenuation correction and anatomic localization. Fasting blood glucose: 98 mg/dl COMPARISON:  CT 60/63/0160 FINDINGS: NECK: No hypermetabolic lymph nodes in the neck. Incidental CT findings: None. CHEST: The 6 mm nodule of concern in the RIGHT upper lobe has no metabolic activity (image 67) Incidental CT findings: Large hiatal hernia with near entirety of the stomach in the LEFT hemithorax. ABDOMEN/PELVIS: Enlarged intensely hypermetabolic mass  in the head of the pancreas measures 4.3 x 4.2 cm with SUV max equal 13.8 ((image 114) A second hypermetabolic focus slightly upstream of the dominant pancreatic mass on image 111) No hypermetabolic lymph nodes in the porta hepatis. Single focus of intense metabolic activity in the posterior RIGHT hepatic lobe with SUV max equal 5.5 on image 100. There is a subtle hypodensity on comparison diagnostic CT scan at this site (image 278/series 2) Incidental CT findings: None. SKELETON: No focal hypermetabolic activity to suggest skeletal metastasis. Incidental CT findings: None. IMPRESSION: 1. Intensely hypermetabolic pancreatic head mass consistent with primary pancreatic  adenocarcinoma. 2. Single hypermetabolic focus in the posterior RIGHT hepatic lobe is concerning for hepatic metastasis. 3. No evidence of metastatic adenopathy in the porta hepatis. 4. No evidence of pulmonary metastasis. No metabolic activity of RIGHT upper lobe pulmonary nodule. 5. Large hiatal hernia with near entirety of the stomach in the LEFT hemithorax. Electronically Signed   By: Genevive Bi M.D.   On: 12/14/2022 11:35    Addendum I have seen the patient, examined her. I agree with the assessment and and plan and have edited the notes.   Pt is doing poorly, with no appetite and eats/drinks very little.  She is very fatigued, spends most time in couch and bed.  We again reviewed nutrition supplement such as Ensure or boost, and encouraged her to get up and move around if she can.  She denies depression.  I personally reviewed her PET scan images, which showed hypermetabolic liver lesion, consistent with liver metastasis.  I discussed the incurable and aggressive nature of her pancreatic cancer, and overall very poor prognosis, especially due to her poor performance status.  She is not a candidate for intensive chemotherapy.  We discussed option of single agent gemcitabine, the benefit of prolonging her life and improve her quality of  life is small due to her poor PS.  We discussed home palliative care and hospice, the logistics and difference between this to service, and I encouraged her to consider.  Her life expectancy is likely a few months, hospice care is appropriate at this stage.  Patient is not sure what she wants to do, wants to think about it and discuss with her family.  I will call her next week for follow-up and make the final decision.  I spent a total of 40 minutes for her visit today, more than 50% time on face-to-face counseling.  Malachy Mood MD 12/21/2022

## 2022-12-20 NOTE — Assessment & Plan Note (Signed)
Reviewed medical records, imaging studies, and labs with the patient and her family members.  -PET scan done 12/14/2022 showed 1. Intensely hypermetabolic pancreatic head mass consistent with primary pancreatic adenocarcinoma. 2. Single hypermetabolic focus in the posterior RIGHT hepatic lobe is concerning for hepatic metastasis.3. No evidence of metastatic adenopathy in the porta hepatis.4. No evidence of pulmonary metastasis. No metabolic activity of RIGHT upper lobe pulmonary nodule.5. Large hiatal hernia with near entirety of the stomach in the LEFT hemithorax.  -genetic testing done with results pending.  -patient with very little to no appetite. Increased weight loss. Very fatigued.  -she and family understand need to improve appetite and nutrition in order for patient to tolerate single agent gemcitabine chemotherapy to fight the cancer. Has tried megace and mirtazapine to help with mood and appetite. Thus far, these medications have not helped. Trial marinol 2.5 mg twice daily as needed sent to her pharmacy ([facility]). Will send to larger, franchise pharmacy if there is trouble getting this medication stocked for her.

## 2022-12-21 ENCOUNTER — Other Ambulatory Visit: Payer: Self-pay

## 2022-12-21 ENCOUNTER — Inpatient Hospital Stay: Payer: Medicare PPO | Attending: Hematology and Oncology | Admitting: Dietician

## 2022-12-21 ENCOUNTER — Inpatient Hospital Stay: Payer: Medicare PPO | Admitting: Nurse Practitioner

## 2022-12-21 ENCOUNTER — Inpatient Hospital Stay: Payer: Medicare PPO | Attending: Hematology and Oncology

## 2022-12-21 VITALS — BP 94/64 | HR 94 | Temp 98.3°F | Resp 16 | Wt 156.0 lb

## 2022-12-21 DIAGNOSIS — Z170 Estrogen receptor positive status [ER+]: Secondary | ICD-10-CM | POA: Insufficient documentation

## 2022-12-21 DIAGNOSIS — C259 Malignant neoplasm of pancreas, unspecified: Secondary | ICD-10-CM

## 2022-12-21 DIAGNOSIS — C250 Malignant neoplasm of head of pancreas: Secondary | ICD-10-CM | POA: Diagnosis not present

## 2022-12-21 DIAGNOSIS — Z79811 Long term (current) use of aromatase inhibitors: Secondary | ICD-10-CM | POA: Insufficient documentation

## 2022-12-21 DIAGNOSIS — C50412 Malignant neoplasm of upper-outer quadrant of left female breast: Secondary | ICD-10-CM | POA: Insufficient documentation

## 2022-12-21 LAB — COMPREHENSIVE METABOLIC PANEL
ALT: 24 U/L (ref 0–44)
AST: 24 U/L (ref 15–41)
Albumin: 3.4 g/dL — ABNORMAL LOW (ref 3.5–5.0)
Alkaline Phosphatase: 85 U/L (ref 38–126)
Anion gap: 11 (ref 5–15)
BUN: 20 mg/dL (ref 8–23)
CO2: 30 mmol/L (ref 22–32)
Calcium: 9 mg/dL (ref 8.9–10.3)
Chloride: 93 mmol/L — ABNORMAL LOW (ref 98–111)
Creatinine, Ser: 1.14 mg/dL — ABNORMAL HIGH (ref 0.44–1.00)
GFR, Estimated: 49 mL/min — ABNORMAL LOW (ref >60)
Glucose, Bld: 124 mg/dL — ABNORMAL HIGH (ref 70–99)
Potassium: 3.2 mmol/L — ABNORMAL LOW (ref 3.5–5.1)
Sodium: 134 mmol/L — ABNORMAL LOW (ref 135–145)
Total Bilirubin: 1.1 mg/dL (ref <1.2)
Total Protein: 6.5 g/dL (ref 6.5–8.1)

## 2022-12-21 LAB — CBC WITH DIFFERENTIAL/PLATELET
Abs Immature Granulocytes: 0.07 10*3/uL (ref 0.00–0.07)
Basophils Absolute: 0.1 10*3/uL (ref 0.0–0.1)
Basophils Relative: 1 %
Eosinophils Absolute: 0.1 10*3/uL (ref 0.0–0.5)
Eosinophils Relative: 1 %
HCT: 41.2 % (ref 36.0–46.0)
Hemoglobin: 15.1 g/dL — ABNORMAL HIGH (ref 12.0–15.0)
Immature Granulocytes: 1 %
Lymphocytes Relative: 22 %
Lymphs Abs: 1.7 10*3/uL (ref 0.7–4.0)
MCH: 33.1 pg (ref 26.0–34.0)
MCHC: 36.7 g/dL — ABNORMAL HIGH (ref 30.0–36.0)
MCV: 90.4 fL (ref 80.0–100.0)
Monocytes Absolute: 0.9 10*3/uL (ref 0.1–1.0)
Monocytes Relative: 12 %
Neutro Abs: 5 10*3/uL (ref 1.7–7.7)
Neutrophils Relative %: 63 %
Platelets: 311 10*3/uL (ref 150–400)
RBC: 4.56 MIL/uL (ref 3.87–5.11)
RDW: 15.2 % (ref 11.5–15.5)
WBC: 7.9 10*3/uL (ref 4.0–10.5)
nRBC: 0 % (ref 0.0–0.2)

## 2022-12-22 ENCOUNTER — Encounter: Payer: Self-pay | Admitting: Nurse Practitioner

## 2022-12-22 ENCOUNTER — Other Ambulatory Visit (HOSPITAL_COMMUNITY): Payer: Self-pay

## 2022-12-22 ENCOUNTER — Other Ambulatory Visit: Payer: Self-pay

## 2022-12-22 ENCOUNTER — Other Ambulatory Visit: Payer: Self-pay | Admitting: Hematology

## 2022-12-22 DIAGNOSIS — C259 Malignant neoplasm of pancreas, unspecified: Secondary | ICD-10-CM

## 2022-12-22 LAB — CANCER ANTIGEN 19-9: CA 19-9: 1329 U/mL — ABNORMAL HIGH (ref 0–35)

## 2022-12-22 MED ORDER — DRONABINOL 2.5 MG PO CAPS
2.5000 mg | ORAL_CAPSULE | Freq: Two times a day (BID) | ORAL | 0 refills | Status: DC
Start: 2022-12-22 — End: 2023-03-08
  Filled 2022-12-22: qty 60, 30d supply, fill #0

## 2022-12-22 MED ORDER — DRONABINOL 2.5 MG PO CAPS
2.5000 mg | ORAL_CAPSULE | Freq: Two times a day (BID) | ORAL | 0 refills | Status: DC
Start: 2022-12-22 — End: 2022-12-22

## 2022-12-23 ENCOUNTER — Other Ambulatory Visit (HOSPITAL_COMMUNITY): Payer: Self-pay

## 2022-12-23 ENCOUNTER — Other Ambulatory Visit: Payer: Self-pay

## 2022-12-28 ENCOUNTER — Encounter: Payer: Self-pay | Admitting: Nurse Practitioner

## 2022-12-28 ENCOUNTER — Inpatient Hospital Stay: Payer: Medicare PPO | Admitting: Nurse Practitioner

## 2022-12-28 DIAGNOSIS — C259 Malignant neoplasm of pancreas, unspecified: Secondary | ICD-10-CM | POA: Diagnosis not present

## 2022-12-28 NOTE — Assessment & Plan Note (Addendum)
Reviewed medical records, imaging studies, and labs with the patient and her family members.  -PET scan done 12/14/2022 showed 1. Intensely hypermetabolic pancreatic head mass consistent with primary pancreatic adenocarcinoma. 2. Single hypermetabolic focus in the posterior RIGHT hepatic lobe is concerning for hepatic metastasis.3. No evidence of metastatic adenopathy in the porta hepatis.4. No evidence of pulmonary metastasis. No metabolic activity of RIGHT upper lobe pulmonary nodule.5. Large hiatal hernia with near entirety of the stomach in the LEFT hemithorax.  -genetic testing done with results pending.  -patient with very little to no appetite. Increased weight loss. Very fatigued.  -appetite and nutrition have not improved. It is not believed that patient would be able to tolerate single agent gemcitabine chemotherapy to fight the cancer. Has tried megace and mirtazapine to help with mood and appetite. Thus far, these medications have not helped. Trial marinol 2.5 mg twice daily as needed was also unsuccessful. Urgent/STAT referral for [facility] was made for patient today.

## 2022-12-28 NOTE — Progress Notes (Signed)
I connected with Erika Huynh on 12/28/22 at  9:00 AM EST by @[email or web address] @ and verified that I am speaking with the correct person using two identifiers.   I discussed the limitations, risks, security and privacy concerns of performing an evaluation and management service by telemedicine and the availability of in-person appointments. I also discussed with the patient that there may be a patient responsible charge related to this service. The patient expressed understanding and agreed to proceed.   Other persons participating in the visit and their role in the encounter: patient's daughter and husband. They are primary care givers    Patient's location: home  Provider's location: [facility]   Chief Complaint: adenocarcinoma of pancreas    Patient Care Team: Erika Montana, MD as PCP - General (Family Medicine) Erika Proud, RN as Oncology Nurse Navigator Erika Angelica, RN as Oncology Nurse Navigator Erika Croissant, MD as Consulting Physician (Hematology and Oncology) Erika Loron, MD as Consulting Physician (General Surgery) Erika Peak, MD as Attending Physician (Radiation Oncology) Erika Mood, MD as Consulting Physician (Hematology and Oncology) Erika Jews, NP as Nurse Practitioner (Hematology and Oncology)  Clinic Day:  12/28/2022  Referring physician: Laurann Montana, MD  ASSESSMENT & PLAN:   Assessment & Plan: Adenocarcinoma of pancreas [facility]) Reviewed medical records, imaging studies, and labs with the patient and her family members.  -PET scan done 12/14/2022 showed 1. Intensely hypermetabolic pancreatic head mass consistent with primary pancreatic adenocarcinoma. 2. Single hypermetabolic focus in the posterior RIGHT hepatic lobe is concerning for hepatic metastasis.3. No evidence of metastatic adenopathy in the porta hepatis.4. No evidence of pulmonary metastasis. No metabolic activity of RIGHT upper lobe pulmonary nodule.5.  Large hiatal hernia with near entirety of the stomach in the LEFT hemithorax.  -genetic testing done with results pending.  -patient with very little to no appetite. Increased weight loss. Very fatigued.  -she and family understand need to improve appetite and nutrition in order for patient to tolerate single agent gemcitabine chemotherapy to fight the cancer. Has tried megace and mirtazapine to help with Huynh and appetite. Thus far, these medications have not helped. Trial marinol 2.5 mg twice daily as needed sent to her pharmacy ([facility]). Will send to larger, franchise pharmacy if there is trouble getting this medication stocked for her.     Plan:  Urgent/STAT referral to Hospice. Patient's husband and family interested in at home care at this time.  Follow up with phone visit in 1 to 2 weeks.  Patient's family understands they can take patient to ED if symptoms worsen and they feel they are unable to care for her at home.   The patient understands the plans discussed today and is in agreement with them.  She knows to contact our office if she develops concerns prior to her next appointment.  I provided 10 minutes of non face-to-face time during this encounter and > 50% was spent counseling as documented under my assessment and plan.    Erika Jews, NP  [facility] [address] [place] [address] Dept: [phone] Dept Fax: [phone]   Orders Placed This Encounter  Procedures   Ambulatory referral to Hospice    Referral Priority:   Urgent    Referral Type:   Consultation    Referral Reason:   Specialty Services Required    Requested Specialty:   Hospice Services  Number of Visits Requested:   1      CHIEF COMPLAINT:  CC: adenocarcinoma of pancrease   Current Treatment:  refer for home hospice care   INTERVAL HISTORY:  Erika Huynh is here today for repeat clinical  assessment. She was last seen in office by Dr. Mosetta Putt and myself on 12/21/2022. At that time, chemotherapy was recommended treatment, however, patient had been unable to eat for weeks. She continues to be very weak. Low performance status. Family having trouble getting the patient up and participating in activities of daily living. The patient still feels very fatigued. She has been unable to increase nutrition, especially protein. Would be unable to tolerate chemotherapy well. Ttrial of marinol was unsuccessful. Patient's family reports she has been unable to consume at least 3 to 4 bottles of ensure, broth, soft foods, or increase her fluid intake. Family would like to have urgent Hospice referral as patient's overall status continues to decline.   I have reviewed the past medical history, past surgical history, social history and family history with the patient and they are unchanged from previous note.  ALLERGIES:  is allergic to propoxyphene.  MEDICATIONS:  Current Outpatient Medications  Medication Sig Dispense Refill   acetaminophen (TYLENOL) 500 MG tablet Take 1,000 mg by mouth every 6 (six) hours as needed (for pain.).     anastrozole (ARIMIDEX) 1 MG tablet TTAKE 1 TABLET BY MOUTH DAILY 90 tablet 3   azelastine (ASTELIN) 0.1 % nasal spray 1-2 sprays in each nostril 1-2 times per day (Patient not taking: Reported on 11/24/2022) 30 mL 5   diphenhydrAMINE (BENADRYL) 25 MG tablet Take 25 mg by mouth every 6 (six) hours as needed. (Patient not taking: Reported on 12/21/2022)     dronabinol (MARINOL) 2.5 MG capsule Take 1 capsule (2.5 mg total) by mouth 2 (two) times daily before a meal. 60 capsule 0   levothyroxine (SYNTHROID) 125 MCG tablet 175 mcg.     losartan (COZAAR) 100 MG tablet Take 100 mg by mouth daily.  (Patient not taking: Reported on 12/21/2022)     meclizine (ANTIVERT) 25 MG tablet Take 25 mg by mouth 3 (three) times daily as needed for dizziness. (Patient not taking: Reported on  12/21/2022)     megestrol (MEGACE ES) 625 MG/5ML suspension Take 5 mLs (625 mg total) by mouth daily. 150 mL 0   Melatonin 10 MG CAPS Take 10 mg by mouth at bedtime as needed (for sleep.). (Patient not taking: Reported on 11/24/2022)     mirtazapine (REMERON SOL-TAB) 15 MG disintegrating tablet Take 15 mg by mouth at bedtime.     montelukast (SINGULAIR) 10 MG tablet 1 tablet 1 time per day daily (Patient not taking: Reported on 11/24/2022) 30 tablet 5   ondansetron (ZOFRAN-ODT) 4 MG disintegrating tablet Take 1-2 tablets (4-8 mg total) by mouth every 8 (eight) hours as needed for nausea or vomiting. (Patient not taking: Reported on 12/21/2022) 45 tablet 0   pantoprazole (PROTONIX) 40 MG tablet Take 1 tablet (40 mg total) by mouth daily. 30 tablet 6   No current facility-administered medications for this visit.    HISTORY OF PRESENT ILLNESS:   Oncology History  Malignant neoplasm of upper-outer quadrant of left breast in female, estrogen receptor positive (HCC)  04/17/2018 Initial Diagnosis   Screening detected left breast asymmetry 0.5 cm by mammogram, ultrasound revealed 0.6 cm mass at 3 o'clock position axilla negative, biopsy revealed grade 2 ILC, ER 90%, PR 90%, Ki-67 1%, HER-2 -1+, T1b N0  stage Ia clinical stage   04/26/2018 Cancer Staging   Staging form: Breast, AJCC 8th Edition - Clinical stage from 04/26/2018: Stage IA (cT1b, cN0, cM0, G2, ER+, PR+, HER2-) - Signed by Erika Croissant, MD on 04/26/2018   04/26/2018 Genetic Testing   Negative.  Genes tested include: ATM, BRCA1, BRCA2, CDH1, CHEK2, PALB2, PTEN, STK11 and TP53.  APC, ATM, AXIN2, BARD1, BMPR1A, BRCA1, BRCA2, BRIP1, CDH1, CDKN2A (p14ARF), CDKN2A (p16INK4a), CKD4, CHEK2, CTNNA1, DICER1, EPCAM (Deletion/duplication testing only), GREM1 (promoter region deletion/duplication testing only), KIT, MEN1, MLH1, MSH2, MSH3, MSH6, MUTYH, NBN, NF1, NHTL1, PALB2, PDGFRA, PMS2, POLD1, POLE, PTEN, RAD50, RAD51C, RAD51D, SDHB, SDHC, SDHD, SMAD4,  SMARCA4. STK11, TP53, TSC1, TSC2, and VHL.  The following genes were evaluated for sequence changes only: SDHA and HOXB13 c.251G>A variant only.   05/08/2018 -  Anti-estrogen oral therapy   Anastrozole 1 mg daily   06/27/2018 Surgery   Right lumpectomy Dwain Sarna): invasive lobular carcinoma with DCIS, grade 2, 0.8cm, ER+ (90%), PR+ (90%), HER2- (1+), Ki67 1%, clear margins, 0/5 SNL negative for carcinoma.    07/12/2018 Cancer Staging   Staging form: Breast, AJCC 8th Edition - Pathologic: Stage IA (pT1b, pN0, cM0, G2, ER+, PR+, HER2-) - Signed by Loa Socks, NP on 07/12/2018   Adenocarcinoma of pancreas (HCC)  11/08/2022 Imaging   CT Chest, abdomen, and pelvis with contrast  IMPRESSION: 1. 4.8 x 3.2 cm pancreatic head mass with associated marked atrophy and pancreatic ductal dilatation involving the neck, body and tail of the pancreas. This is compatible with a primary pancreatic adenocarcinoma and is encasing the proximal left gastric, common hepatic and splenic arteries. 2. 5.7 x 5.4 cm solid lung nodule in the right upper lobe. This is nonspecific and can be reassessed on follow-up imaging. A solitary metastasis is possibility. 3. Single small mesenteric lymph node superior to the head of the pancreas with a density similar to the pancreatic head mass, suspicious for a small metastatic node. 4. Large hiatal hernia containing the entire stomach. 5. Cholelithiasis. 6. Colonic diverticulosis. 7. Small umbilical hernia containing fat and small to moderate-sized bilateral inguinal hernias containing fat. 8.  Calcific coronary artery and aortic atherosclerosis.   11/18/2022 Procedure   Endoscopic ultrasound - Dr. Meridee Score  Large, irregular mass of pancreatic head measuring 4.2 X 3.5 cm in diameter. Evidence of mass abutting the portal vein. Consistent with pancreatic adenocarcinoma. Staged at T4,N0,Mx per endosonographic staging criteria.   11/24/2022 Initial Diagnosis    Adenocarcinoma of pancreas (HCC)   12/07/2022 PET scan   NM PET scan skull base to thigh IMPRESSION: 1. Intensely hypermetabolic pancreatic head mass consistent with primary pancreatic adenocarcinoma. 2. Single hypermetabolic focus in the posterior RIGHT hepatic lobe is concerning for hepatic metastasis. 3. No evidence of metastatic adenopathy in the porta hepatis. 4. No evidence of pulmonary metastasis. No metabolic activity of RIGHT upper lobe pulmonary nodule. 5. Large hiatal hernia with near entirety of the stomach in the LEFT hemithorax.       REVIEW OF SYSTEMS:   Constitutional: Denies fevers or chills. Has no appetite and continues to lose weight. Uninterested in any activities. Sleeping most of the time.  Eyes: Denies blurriness of vision Ears, nose, mouth, throat, and face: Denies mucositis or sore throat Respiratory: Denies cough, dyspnea or wheezes Cardiovascular: Denies palpitation, chest discomfort or lower extremity swelling Gastrointestinal:  no appetite. Gagging with any intake of food or fluids orally.  Skin: Denies abnormal skin rashes Lymphatics: Denies new lymphadenopathy or easy bruising Neurological:Denies  numbness, tingling or new weaknesses Behavioral/Psych: patient uninterested in participating in activities of daily living. Per family, she wants to sleep nearly all the time.  All other systems were reviewed with the patient and are negative.   VITALS:  There were no vitals taken for this visit.  Wt Readings from Last 3 Encounters:  12/21/22 156 lb (70.8 kg)  11/24/22 162 lb (73.5 kg)  11/18/22 200 lb (90.7 kg)    There is no height or weight on file to calculate BMI.  Performance status (ECOG): 3 - Symptomatic, >50% confined to bed   LABORATORY DATA:  I have reviewed the data as listed    Component Value Date/Time   NA 134 (L) 12/21/2022 1251   K 3.2 (L) 12/21/2022 1251   CL 93 (L) 12/21/2022 1251   CO2 30 12/21/2022 1251   GLUCOSE 124 (H)  12/21/2022 1251   BUN 20 12/21/2022 1251   CREATININE 1.14 (H) 12/21/2022 1251   CREATININE 0.83 04/26/2018 0807   CALCIUM 9.0 12/21/2022 1251   PROT 6.5 12/21/2022 1251   ALBUMIN 3.4 (L) 12/21/2022 1251   AST 24 12/21/2022 1251   AST 13 (L) 04/26/2018 0807   ALT 24 12/21/2022 1251   ALT 13 04/26/2018 0807   ALKPHOS 85 12/21/2022 1251   BILITOT 1.1 12/21/2022 1251   BILITOT 0.4 04/26/2018 0807   GFRNONAA 49 (L) 12/21/2022 1251   GFRNONAA >60 04/26/2018 0807   GFRAA >60 04/26/2018 0807    Lab Results  Component Value Date   WBC 7.9 12/21/2022   NEUTROABS 5.0 12/21/2022   HGB 15.1 (H) 12/21/2022   HCT 41.2 12/21/2022   MCV 90.4 12/21/2022   PLT 311 12/21/2022      RADIOGRAPHIC STUDIES: I have personally reviewed the radiological images as listed and agreed with the findings in the report. NM PET Image Initial (PI) Skull Base To Thigh  Result Date: 12/14/2022 CLINICAL DATA:  Subsequent treatment strategy for pancreatic cancer staging. EXAM: NUCLEAR MEDICINE PET SKULL BASE TO THIGH TECHNIQUE: 8.0 mCi F-18 FDG was injected intravenously. Full-ring PET imaging was performed from the skull base to thigh after the radiotracer. CT data was obtained and used for attenuation correction and anatomic localization. Fasting blood glucose: 98 mg/dl COMPARISON:  CT 34/74/2595 FINDINGS: NECK: No hypermetabolic lymph nodes in the neck. Incidental CT findings: None. CHEST: The 6 mm nodule of concern in the RIGHT upper lobe has no metabolic activity (image 67) Incidental CT findings: Large hiatal hernia with near entirety of the stomach in the LEFT hemithorax. ABDOMEN/PELVIS: Enlarged intensely hypermetabolic mass in the head of the pancreas measures 4.3 x 4.2 cm with SUV max equal 13.8 ((image 114) A second hypermetabolic focus slightly upstream of the dominant pancreatic mass on image 111) No hypermetabolic lymph nodes in the porta hepatis. Single focus of intense metabolic activity in the posterior  RIGHT hepatic lobe with SUV max equal 5.5 on image 100. There is a subtle hypodensity on comparison diagnostic CT scan at this site (image 278/series 2) Incidental CT findings: None. SKELETON: No focal hypermetabolic activity to suggest skeletal metastasis. Incidental CT findings: None. IMPRESSION: 1. Intensely hypermetabolic pancreatic head mass consistent with primary pancreatic adenocarcinoma. 2. Single hypermetabolic focus in the posterior RIGHT hepatic lobe is concerning for hepatic metastasis. 3. No evidence of metastatic adenopathy in the porta hepatis. 4. No evidence of pulmonary metastasis. No metabolic activity of RIGHT upper lobe pulmonary nodule. 5. Large hiatal hernia with near entirety of the stomach in the  LEFT hemithorax. Electronically Signed   By: Genevive Bi M.D.   On: 12/14/2022 11:35

## 2022-12-29 ENCOUNTER — Telehealth: Payer: Self-pay | Admitting: Nurse Practitioner

## 2022-12-29 DIAGNOSIS — I10 Essential (primary) hypertension: Secondary | ICD-10-CM | POA: Diagnosis not present

## 2023-01-05 ENCOUNTER — Telehealth: Payer: Self-pay | Admitting: Nurse Practitioner

## 2023-01-12 ENCOUNTER — Telehealth: Payer: Medicare PPO | Admitting: Nurse Practitioner

## 2023-03-19 DEATH — deceased

## 2023-09-26 ENCOUNTER — Ambulatory Visit: Payer: Medicare PPO | Admitting: Hematology and Oncology

## 2023-09-28 ENCOUNTER — Other Ambulatory Visit: Payer: Self-pay
